# Patient Record
Sex: Female | Born: 1960 | Race: White | Hispanic: No | Marital: Single | Smoking: Current every day smoker
Health system: Southern US, Community
[De-identification: ages and names within clinical notes are randomized; demographics above are authoritative.]

## PROBLEM LIST (undated history)

## (undated) DIAGNOSIS — I10 Essential (primary) hypertension: Secondary | ICD-10-CM

## (undated) DIAGNOSIS — E119 Type 2 diabetes mellitus without complications: Secondary | ICD-10-CM

## (undated) DIAGNOSIS — C801 Malignant (primary) neoplasm, unspecified: Secondary | ICD-10-CM

## (undated) HISTORY — PX: ABDOMINAL HYSTERECTOMY: SHX81

## (undated) HISTORY — PX: SKIN CANCER EXCISION: SHX779

---

## 2004-01-11 ENCOUNTER — Emergency Department: Payer: Self-pay | Admitting: Emergency Medicine

## 2004-03-17 ENCOUNTER — Ambulatory Visit: Payer: Self-pay | Admitting: Family Medicine

## 2004-04-30 ENCOUNTER — Ambulatory Visit: Payer: Self-pay

## 2004-06-28 ENCOUNTER — Ambulatory Visit: Payer: Self-pay | Admitting: Gastroenterology

## 2004-06-29 ENCOUNTER — Emergency Department: Payer: Self-pay | Admitting: Emergency Medicine

## 2004-07-21 ENCOUNTER — Ambulatory Visit: Payer: Self-pay | Admitting: Gastroenterology

## 2004-10-22 ENCOUNTER — Ambulatory Visit: Payer: Self-pay | Admitting: Nurse Practitioner

## 2004-11-24 ENCOUNTER — Emergency Department: Payer: Self-pay | Admitting: Emergency Medicine

## 2005-04-19 ENCOUNTER — Ambulatory Visit: Payer: Self-pay | Admitting: Gastroenterology

## 2005-07-15 ENCOUNTER — Emergency Department: Payer: Self-pay | Admitting: Emergency Medicine

## 2006-08-16 ENCOUNTER — Emergency Department: Payer: Self-pay | Admitting: Emergency Medicine

## 2007-03-09 ENCOUNTER — Emergency Department: Payer: Self-pay | Admitting: Emergency Medicine

## 2007-03-22 ENCOUNTER — Ambulatory Visit: Payer: Self-pay | Admitting: Family Medicine

## 2010-08-09 ENCOUNTER — Emergency Department: Payer: Self-pay | Admitting: Emergency Medicine

## 2011-12-21 ENCOUNTER — Emergency Department: Payer: Self-pay | Admitting: Emergency Medicine

## 2012-04-09 ENCOUNTER — Emergency Department: Payer: Self-pay | Admitting: Emergency Medicine

## 2012-04-09 LAB — BASIC METABOLIC PANEL
Anion Gap: 6 — ABNORMAL LOW (ref 7–16)
BUN: 12 mg/dL (ref 7–18)
Calcium, Total: 9 mg/dL (ref 8.5–10.1)
Chloride: 104 mmol/L (ref 98–107)
Co2: 27 mmol/L (ref 21–32)
Creatinine: 0.7 mg/dL (ref 0.60–1.30)
EGFR (African American): >60
EGFR (Non-African Amer.): >60
Osmolality: 276 (ref 275–301)
Potassium: 4.3 mmol/L (ref 3.5–5.1)

## 2012-04-09 LAB — CBC
HCT: 40.7 % (ref 35.0–47.0)
HGB: 14 g/dL (ref 12.0–16.0)
MCH: 32.5 pg (ref 26.0–34.0)
MCHC: 34.3 g/dL (ref 32.0–36.0)
MCV: 95 fL (ref 80–100)
Platelet: 232 10*3/uL (ref 150–440)
RDW: 12.5 % (ref 11.5–14.5)
WBC: 8.4 10*3/uL (ref 3.6–11.0)

## 2012-04-09 LAB — TROPONIN I: Troponin-I: <0.02 ng/mL

## 2012-10-23 ENCOUNTER — Emergency Department: Payer: Self-pay | Admitting: Emergency Medicine

## 2012-10-23 LAB — CBC
HCT: 38.9 % (ref 35.0–47.0)
HGB: 13.4 g/dL (ref 12.0–16.0)
MCH: 33.3 pg (ref 26.0–34.0)
Platelet: 235 10*3/uL (ref 150–440)
RBC: 4.04 10*6/uL (ref 3.80–5.20)
RDW: 13.3 % (ref 11.5–14.5)
WBC: 10.1 10*3/uL (ref 3.6–11.0)

## 2012-10-23 LAB — COMPREHENSIVE METABOLIC PANEL
Albumin: 3.7 g/dL (ref 3.4–5.0)
Anion Gap: 3 — ABNORMAL LOW (ref 7–16)
BUN: 16 mg/dL (ref 7–18)
Bilirubin,Total: 0.3 mg/dL (ref 0.2–1.0)
Chloride: 104 mmol/L (ref 98–107)
Co2: 29 mmol/L (ref 21–32)
Creatinine: 0.91 mg/dL (ref 0.60–1.30)
EGFR (African American): >60
Glucose: 103 mg/dL — ABNORMAL HIGH (ref 65–99)
Sodium: 136 mmol/L (ref 136–145)
Total Protein: 6.9 g/dL (ref 6.4–8.2)

## 2012-10-23 LAB — URINALYSIS, COMPLETE
Bilirubin,UR: NEGATIVE
Blood: NEGATIVE
Ketone: NEGATIVE
Nitrite: NEGATIVE
Protein: NEGATIVE

## 2013-01-07 ENCOUNTER — Ambulatory Visit: Payer: Self-pay | Admitting: Family Medicine

## 2013-09-27 ENCOUNTER — Inpatient Hospital Stay: Payer: Self-pay | Admitting: Internal Medicine

## 2013-09-27 LAB — CBC WITH DIFFERENTIAL/PLATELET
BASOS PCT: 0.8 %
Basophil #: 0.1 10*3/uL (ref 0.0–0.1)
EOS ABS: 0.4 10*3/uL (ref 0.0–0.7)
Eosinophil %: 4.5 %
HCT: 41.3 % (ref 35.0–47.0)
HGB: 14 g/dL (ref 12.0–16.0)
LYMPHS ABS: 3.3 10*3/uL (ref 1.0–3.6)
Lymphocyte %: 38.3 %
MCH: 33.1 pg (ref 26.0–34.0)
MCHC: 34 g/dL (ref 32.0–36.0)
MCV: 97 fL (ref 80–100)
MONO ABS: 0.3 x10 3/mm (ref 0.2–0.9)
Monocyte %: 3.8 %
NEUTROS PCT: 52.6 %
Neutrophil #: 4.5 10*3/uL (ref 1.4–6.5)
PLATELETS: 232 10*3/uL (ref 150–440)
RBC: 4.24 10*6/uL (ref 3.80–5.20)
RDW: 12.7 % (ref 11.5–14.5)
WBC: 8.6 10*3/uL (ref 3.6–11.0)

## 2013-09-27 LAB — URINALYSIS, COMPLETE
Bilirubin,UR: NEGATIVE
Blood: NEGATIVE
GLUCOSE, UR: NEGATIVE mg/dL (ref 0–75)
Ketone: NEGATIVE
LEUKOCYTE ESTERASE: NEGATIVE
NITRITE: NEGATIVE
PH: 5 (ref 4.5–8.0)
Protein: NEGATIVE
SPECIFIC GRAVITY: 1.03 (ref 1.003–1.030)
Squamous Epithelial: 32
WBC UR: 2 /HPF (ref 0–5)

## 2013-09-27 LAB — BASIC METABOLIC PANEL
ANION GAP: 9 (ref 7–16)
BUN: 20 mg/dL — AB (ref 7–18)
Calcium, Total: 8.9 mg/dL (ref 8.5–10.1)
Chloride: 103 mmol/L (ref 98–107)
Co2: 29 mmol/L (ref 21–32)
Creatinine: 1 mg/dL (ref 0.60–1.30)
EGFR (Non-African Amer.): >60
GLUCOSE: 159 mg/dL — AB (ref 65–99)
OSMOLALITY: 287 (ref 275–301)
Potassium: 4 mmol/L (ref 3.5–5.1)
Sodium: 141 mmol/L (ref 136–145)

## 2013-09-27 LAB — LIPID PANEL
CHOLESTEROL: 263 mg/dL — AB (ref 0–200)
HDL Cholesterol: 36 mg/dL — ABNORMAL LOW (ref 40–60)
Triglycerides: 497 mg/dL — ABNORMAL HIGH (ref 0–200)

## 2013-09-27 LAB — TROPONIN I

## 2014-05-31 NOTE — Discharge Summary (Signed)
PATIENT NAME:  [name], BAUTCH MR#:  [id number] DATE OF BIRTH:  Sep 16, 1960  DATE OF ADMISSION:  09/27/2013 DATE OF DISCHARGE:  09/28/2013  PRESENTING COMPLAINT: Difficulty with vision.   DISCHARGE DIAGNOSES:  1.  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  2.  Hypertension.  3.  Hyperlipidemia.  4.  Type 2 diabetes.   CODE STATUS: Full code.   MEDICATIONS:  1.  Metoprolol 25 mg b.i.d.  2.  Citalopram 20 mg daily.  3.  Metformin 500 mg b.i.d.  4.  Protonix 40 mg p.o. daily.  5.  Aspirin 81 mg daily.  6.  Pravastatin 10 mg daily at bedtime.  7.  Plavix 75 mg daily.   Follow up with Dr. Clayborn Bigness in 1 to 2 weeks.   The patient advised smoking cessation.   Ultrasound carotid Doppler showed mild heterogenous atherosclerotic plaque results in less than 50% diameter stenosis of the left internal carotid artery. Trace atherosclerotic plaque on the right without evidence of stenosis. Vertebral arteries are patent. Echo Doppler showed ejection fraction of 55% to 60%, borderline left ventricular hypertrophy, mildly increased left ventricular posterior wall thickness. MRI of the brain showed acute infarct of the right PCA territory without hemorrhage. Urinalysis negative for urinary tract infection. CBC within normal limits. Basic metabolic panel within normal limits, except glucose of 159, cholesterol is 263, triglycerides 497, HDL is 36.   BRIEF SUMMARY OF HOSPITAL COURSE: [name] is a 54 year old with history of type 2 diabetes, hypertension along with hyperlipidemia comes in with:  1.  Acute right PCA territory stroke. The patient presented with blurred vision and headache for the last three days. She already is taking baby aspirin now. The patient was placed on aspirin plus Plavix and given her risk factors of hyperlipidemia, family history of stroke, diabetes and hyperlipidemia. Echo results as above were noted. Ultrasound carotid Doppler did not show any  blockages. PT; patient did very well with PT. No PT needs were needed at home. Her symptoms remained stable. She will follow up with Dr. Clayborn Bigness as outpatient.  2.  Hypertension, on metoprolol.  3.  Type 2 diabetes, on metformin.  4.  Hyperlipidemia, now started on pravastatin.  5.  Tobacco abuse. Smoking cessation advised.   Hospital stay otherwise remained stable.   CODE STATUS: The patient remained a full code.   TIME SPENT: 40 minutes.   ____________________________ Hart Rochester Posey Pronto, MD sap:jc D: 10/03/2013 12:26:58 ET T: 10/03/2013 13:36:03 ET JOB#: [id number]  cc: Tawyna Pellot A. Posey Pronto, MD, <Dictator> Ilda Basset MD ELECTRONICALLY SIGNED 10/15/2013 14:50

## 2014-05-31 NOTE — H&P (Signed)
PATIENT NAME:  [name], [name] MR#:  [id number] DATE OF BIRTH:  [birth year]  DATE OF ADMISSION:  09/27/2013  PRIMARY CARE PHYSICIAN:  Lavera Guise, MD.  CHIEF COMPLAINT: Blurred vision and headache  for the last 3 days.   HISTORY OF PRESENT ILLNESS: The patient is a 54 year old pleasant Caucasian female with past medical history of hypertension, hyperlipidemia, and type 2 diabetes with history of smoking, comes to the Emergency Room after she started having increasing blurred vision while driving up to work. She has been having some headaches, more on the right and some sinus congestion thinking it was her sinusitis and sinus congestion. She did not pay much attention until she started having very fuzzy blurred vision. She came to the Emergency Room and was found to have acute infarct, right PCA territory, without hemorrhage. She is being admitted for further evaluation and management. The patient received an aspirin 324 mg p.o. in the Emergency Room. She is hemodynamically stable. She had some tingling in both her fingers and some unsteady gait for the last 3 days. Denies any dysarthria, dysphagia or any other focal motor weakness. The patient normally takes aspirin 81 mg at home.   PAST MEDICAL HISTORY:  1. Diabetes.  2. Hypertension.  3. History of hyperlipidemia, not on any statins.  4. Tubal ligation.  5. C-section.   ALLERGIES: MORPHINE AND SULFA DRUGS.   MEDICATIONS:  1. Aspirin 81 mg daily.  2. Zyloprim 20 mg daily.  3. Metformin 500 mg b.i.d.  4. Metoprolol 25 mg b.i.d.  5. Protonix 20 mg p.o. daily.   SOCIAL HISTORY: She works in [employer]. She smokes about a pack a day. Denies any alcohol use. Smoking cessation advised.   FAMILY HISTORY: Positive for stroke in grandmother, who died of it and grandmother.   REVIEW OF SYSTEMS:  CONSTITUTIONAL: No fever, fatigue, weakness.  EYES: No fever, fatigue, or weakness.  EYES: Positive for blurred vision. No glaucoma or  cataract.  EARS, NOSE, THROAT: No tinnitus, ear pain, hearing loss or postnasal drip.  RESPIRATORY: No cough, wheeze, hemoptysis or dyspnea.  CARDIOVASCULAR: No orthopnea, chest pain, edema or arrhythmia. Positive for hypertension.  GASTROINTESTINAL: No nausea, vomiting, diarrhea, abdominal pain, or GERD.  GENITOURINARY: No dysuria, hematuria, or frequency.  ENDOCRINE: No polyuria, nocturia or thyroid problems.  HEMATOLOGY: No anemia or easy bruising.  SKIN: No acne, rash or lesion.  MUSCULOSKELETAL: Positive for arthritis and back pain. No swelling or gout.  NEUROLOGIC: Positive for weakness, numbness, and difficult vision. Positive for CVA.  PSYCHIATRIC: No anxiety, depression or bipolar disorder.   All other systems reviewed and negative.   PHYSICAL EXAMINATION:  GENERAL: The patient is awake, alert, oriented x3, not in acute distress. VITAL SIGNS: Afebrile, pulse is 69, blood pressure is 176/92, saturations are 100% on room air.  HEENT: Atraumatic, normocephalic. Pupils: PERRLA. EOMI intact. Oral mucosa is moist.  NECK: Supple. No JVD. No carotid bruit.  LUNGS: Clear to auscultation bilaterally. No rales, rhonchi, respiratory distress or labored breathing.  CARDIOVASCULAR: Both heart sounds are normal. Rate, rhythm regular. PMI not lateralized. Chest is nontender.  EXTREMITIES: Good pedal pulses, good femoral pulses. No lower extremity edema.  ABDOMEN: Soft, benign, nontender. No organomegaly. Positive bowel sounds.  NEUROLOGIC: Grossly intact cranial nerves II through XII. No motor or sensory deficit. The patient has blurred vision bilaterally. Her pupils are reactive bilaterally to light. No nystagmus. No facial droop. Sensory exam and motor system exam appears normal. Reflexes 1+ in  both upper and lower extremities. Plantars downgoing. Gait not tested. Unable to do a detailed eye examination.  SKIN: Warm and dry.  PSYCHIATRIC: The patient is awake, alert, oriented x3.   LABORATORY  DATA: EKG normal sinus rhythm.   MRI of the brain shows acute infarct, right PCA territory without hemorrhage.   Urinalysis negative for urinary tract infection.   CBC within normal limits. Basic metabolic panel within normal limits. Glucose is 159, BUN is 20. Troponin is 0.02.   CT head without contrast  shows right temporal and occipital edema. Does not have typical appearance of infarct and is worrisome for mass lesion.   ASSESSMENT: A 54 year old patient with history of type 2 diabetes and hypertension along with hyperlipidemia, comes in with:  1. Acute right PCA territory stroke. The patient presented with blurred vision and headache for the last 3 days. She is already taking a baby aspirin. I will increase it to aspirin plus Plavix given her other risk factors. The patient does have risk factors of hypertension, diabetes, family history of stroke, hyperlipidemia, and history of hyperlipidemia. Will obtain echo Doppler of the heart and carotid Doppler as part of the stroke workup. 2. Hypertension. Resume her metoprolol. We will avoid sudden drops in blood pressure.  3. Type 2 diabetes. Sliding scale and p.o. metformin.  4. Hyperlipidemia. We will start the patient on pravastatin. She was supposed to start on some statin; however, she did not.  5. Deep vein prophylaxis, on aspirin and Plavix. The patient is ambulatory.  6. Physical therapy will see patient.   The patient's clinical course, hospital admission, and plan was discussed with the patient and the patient's son who was present in the Emergency Room. She remained a full code.   TIME SPENT: 50 minutes,   ____________________________ Gus Height A. Posey Pronto, MD sap:ls D: 09/27/2013 17:49:23 ET T: 09/27/2013 18:01:57 ET JOB#: [id number]  cc: Tyan Dy A. Posey Pronto, MD, <Dictator> Lavera Guise, MD Ilda Basset MD ELECTRONICALLY SIGNED 10/03/2013 12:30

## 2014-05-31 NOTE — H&P (Signed)
PATIENT NAME:  [name], [name] MR#:  [id number] DATE OF BIRTH:  1961/01/02  DATE OF ADMISSION:  09/27/2013  NO DICTATION<AutoDeleted>   ____________________________ Gus Height A. Posey Pronto, MD sap:ls D: 09/27/2013 17:42:01 ET T: 09/27/2013 18:01:11 ET JOB#: [id number]  cc: Ernest Orr A. Posey Pronto, MD, <Dictator> Ilda Basset MD ELECTRONICALLY SIGNED 10/03/2013 12:29

## 2014-07-01 ENCOUNTER — Encounter (HOSPITAL_COMMUNITY): Payer: Self-pay | Admitting: Cardiology

## 2014-07-01 ENCOUNTER — Emergency Department (HOSPITAL_COMMUNITY): Payer: Medicaid Other

## 2014-07-01 ENCOUNTER — Emergency Department (HOSPITAL_COMMUNITY)
Admission: EM | Admit: 2014-07-01 | Discharge: 2014-07-01 | Disposition: A | Discharge status: Home / Self Care | Payer: Medicaid Other | Attending: Emergency Medicine | Admitting: Emergency Medicine

## 2014-07-01 DIAGNOSIS — Y9289 Other specified places as the place of occurrence of the external cause: Secondary | ICD-10-CM | POA: Insufficient documentation

## 2014-07-01 DIAGNOSIS — Z79899 Other long term (current) drug therapy: Secondary | ICD-10-CM | POA: Insufficient documentation

## 2014-07-01 DIAGNOSIS — S52092A Other fracture of upper end of left ulna, initial encounter for closed fracture: Secondary | ICD-10-CM | POA: Diagnosis not present

## 2014-07-01 DIAGNOSIS — Y998 Other external cause status: Secondary | ICD-10-CM | POA: Diagnosis not present

## 2014-07-01 DIAGNOSIS — E119 Type 2 diabetes mellitus without complications: Secondary | ICD-10-CM | POA: Diagnosis not present

## 2014-07-01 DIAGNOSIS — Z720 Tobacco use: Secondary | ICD-10-CM | POA: Insufficient documentation

## 2014-07-01 DIAGNOSIS — Z85828 Personal history of other malignant neoplasm of skin: Secondary | ICD-10-CM | POA: Diagnosis not present

## 2014-07-01 DIAGNOSIS — S53402A Unspecified sprain of left elbow, initial encounter: Secondary | ICD-10-CM | POA: Insufficient documentation

## 2014-07-01 DIAGNOSIS — S59902A Unspecified injury of left elbow, initial encounter: Secondary | ICD-10-CM | POA: Diagnosis present

## 2014-07-01 DIAGNOSIS — X58XXXA Exposure to other specified factors, initial encounter: Secondary | ICD-10-CM | POA: Insufficient documentation

## 2014-07-01 DIAGNOSIS — S42402A Unspecified fracture of lower end of left humerus, initial encounter for closed fracture: Secondary | ICD-10-CM

## 2014-07-01 DIAGNOSIS — Y9389 Activity, other specified: Secondary | ICD-10-CM | POA: Insufficient documentation

## 2014-07-01 DIAGNOSIS — I10 Essential (primary) hypertension: Secondary | ICD-10-CM | POA: Insufficient documentation

## 2014-07-01 HISTORY — DX: Malignant (primary) neoplasm, unspecified: C80.1

## 2014-07-01 HISTORY — DX: Essential (primary) hypertension: I10

## 2014-07-01 HISTORY — DX: Type 2 diabetes mellitus without complications: E11.9

## 2014-07-01 MED ORDER — HYDROCODONE-ACETAMINOPHEN 5-325 MG PO TABS: 1.0000 | ORAL_TABLET | ORAL | Status: DC | PRN

## 2014-07-01 MED ORDER — HYDROCODONE-ACETAMINOPHEN 5-325 MG PO TABS
1.0000 | ORAL_TABLET | Freq: Once | ORAL | Status: AC
  Administered 2014-07-01: 1 via ORAL
  Filled 2014-07-01: qty 1

## 2014-07-01 NOTE — ED Notes (Signed)
Injured left arm 3 weeks ago.  Was holding a child in her arms and the child jumped and pt states she felt a pop in the left arm at that time.  States pain is no better.

## 2014-07-01 NOTE — Discharge Instructions (Signed)
Sprain A sprain happens when the bands of tissue that connect bones and hold joints together (ligaments) stretch too much or tear. HOME CARE  Raise (elevate) the injured area to lessen puffiness (swelling).  Put ice on the injured area 2 times a day for 2-3 days.  Put ice in a plastic bag.  Place a towel between your skin and the bag.  Leave the ice on for 15 minutes.  Only take medicine as told by your doctor.  Protect your injured area until your pain and stiffness go away.  Do not get your cast or splint wet. Cover your cast or splint with a plastic bag when you shower or take a bath. Do not swim in a pool.  Your doctor may suggest exercises during your recovery to keep from getting stiff. GET HELP RIGHT AWAY IF:   Your cast or splint becomes damaged.  Your pain gets worse. MAKE SURE YOU:   Understand these instructions.  Will watch this condition.  Will get help right away if you are not doing well or get worse. Document Released: 07/13/2007 Document Revised: 11/14/2012 Document Reviewed: 02/05/2011 [facility] Patient Information 2015 [place], [place]. This information is not intended to replace advice given to you by your health care provider. Make sure you discuss any questions you have with your health care provider.   You may take the hydrocodone prescribed for pain relief.  This will make you drowsy - do not drive within 4 hours of taking this medication.

## 2014-07-03 NOTE — ED Provider Notes (Signed)
CSN: [id number]     Arrival date & time 07/01/14  1242 History   First MD Initiated Contact with Patient 07/01/14 1306     No chief complaint on file.    (Consider location/radiation/quality/duration/timing/severity/associated sxs/prior Treatment) The history is provided by the patient.   [name] is a 54 y.o.  right handed female  With a 3 week history of pain in her left elbow since hyperextending it while attempting to "boost" her grandchild over a horse fence.  She has used ice, rest, tylenol and motrin and continues to have pain.  She denies weakness distal to the elbow, no swelling, numbness or other complaint.   Past Medical History  Diagnosis Date  . Diabetes mellitus without complication   . Hypertension   . Cancer    Past Surgical History  Procedure Laterality Date  . Abdominal hysterectomy    . Skin cancer excision     History reviewed. No pertinent family history. History  Substance Use Topics  . Smoking status: Current Every Day Smoker  . Smokeless tobacco: Not on file  . Alcohol Use: No   OB History    No data available     Review of Systems  Constitutional: Negative for fever.  Musculoskeletal: Positive for arthralgias. Negative for myalgias and joint swelling.  Skin: Negative.   Neurological: Negative for weakness and numbness.      Allergies  Doxycycline and Sulfur  Home Medications   Prior to Admission medications   Medication Sig Start Date End Date Taking? Authorizing Provider  aspirin-acetaminophen-caffeine (EXCEDRIN MIGRAINE) [phone] MG per tablet Take 1 tablet by mouth every 6 (six) hours as needed for headache.   Yes Historical Provider, MD  metFORMIN (GLUCOPHAGE) 500 MG tablet Take 500 mg by mouth 2 (two) times daily with a meal.   Yes Historical Provider, MD  metoprolol tartrate (LOPRESSOR) 25 MG tablet Take 25 mg by mouth 2 (two) times daily.   Yes Historical Provider, MD  HYDROcodone-acetaminophen (NORCO/VICODIN) 5-325 MG per  tablet Take 1 tablet by mouth every 4 (four) hours as needed. 07/01/14   Gina Jefferson, PA-C   BP 154/72 mmHg  Pulse 78  Temp(Src) 98.2 F (36.8 C) (Oral)  Resp 18  Ht 5' 4.5" (1.638 m)  Wt 187 lb (84.823 kg)  BMI 31.61 kg/m2  SpO2 98% Physical Exam  Constitutional: She appears well-developed and well-nourished.  HENT:  Head: Atraumatic.  Neck: Normal range of motion.  Cardiovascular:  Pulses equal bilaterally  Musculoskeletal: She exhibits tenderness.       Left elbow: She exhibits normal range of motion, no swelling and no effusion. Tenderness found. Medial epicondyle tenderness noted.  Neurological: She is alert. She has normal strength. She displays normal reflexes. No sensory deficit.  Skin: Skin is warm and dry.  Psychiatric: She has a normal mood and affect.    ED Course  Procedures (including critical care time) Labs Review Labs Reviewed - No data to display  Imaging Review No results found.   EKG Interpretation None      MDM   Final diagnoses:  Elbow sprain, left, initial encounter  Elbow fracture, left, closed, initial encounter    Patients labs and/or radiological studies were reviewed and considered during the medical decision making and disposition process.  Results were also discussed with patient. Pt was placed in a sling, hydrocodone prescribed. Ortho referrals given for f/u care.    The patient appears reasonably screened and/or stabilized for discharge and I doubt any other  medical condition or other [facility] requiring further screening, evaluation, or treatment in the ED at this time prior to discharge.     Gina Jefferson, PA-C 07/03/14 1852  Gina Morrison, MD 07/08/14 [phone]

## 2018-03-02 ENCOUNTER — Observation Stay: Payer: Medicaid Other

## 2018-03-02 ENCOUNTER — Observation Stay
Admission: EM | Admit: 2018-03-02 | Discharge: 2018-03-03 | Disposition: A | Discharge status: Home / Self Care | Payer: Medicaid Other | Attending: Internal Medicine | Admitting: Internal Medicine

## 2018-03-02 ENCOUNTER — Emergency Department: Payer: Medicaid Other

## 2018-03-02 ENCOUNTER — Encounter: Payer: Self-pay | Admitting: Emergency Medicine

## 2018-03-02 ENCOUNTER — Other Ambulatory Visit: Payer: Self-pay

## 2018-03-02 DIAGNOSIS — F17200 Nicotine dependence, unspecified, uncomplicated: Secondary | ICD-10-CM | POA: Diagnosis not present

## 2018-03-02 DIAGNOSIS — Z8673 Personal history of transient ischemic attack (TIA), and cerebral infarction without residual deficits: Secondary | ICD-10-CM | POA: Insufficient documentation

## 2018-03-02 DIAGNOSIS — R55 Syncope and collapse: Secondary | ICD-10-CM | POA: Diagnosis present

## 2018-03-02 DIAGNOSIS — E119 Type 2 diabetes mellitus without complications: Secondary | ICD-10-CM | POA: Insufficient documentation

## 2018-03-02 DIAGNOSIS — H6690 Otitis media, unspecified, unspecified ear: Secondary | ICD-10-CM | POA: Insufficient documentation

## 2018-03-02 DIAGNOSIS — Z7984 Long term (current) use of oral hypoglycemic drugs: Secondary | ICD-10-CM | POA: Insufficient documentation

## 2018-03-02 DIAGNOSIS — G459 Transient cerebral ischemic attack, unspecified: Principal | ICD-10-CM | POA: Insufficient documentation

## 2018-03-02 DIAGNOSIS — K219 Gastro-esophageal reflux disease without esophagitis: Secondary | ICD-10-CM | POA: Diagnosis not present

## 2018-03-02 DIAGNOSIS — Z79899 Other long term (current) drug therapy: Secondary | ICD-10-CM | POA: Diagnosis not present

## 2018-03-02 DIAGNOSIS — F329 Major depressive disorder, single episode, unspecified: Secondary | ICD-10-CM | POA: Insufficient documentation

## 2018-03-02 DIAGNOSIS — I10 Essential (primary) hypertension: Secondary | ICD-10-CM | POA: Diagnosis not present

## 2018-03-02 DIAGNOSIS — M545 Low back pain: Secondary | ICD-10-CM | POA: Diagnosis not present

## 2018-03-02 LAB — COMPREHENSIVE METABOLIC PANEL
ALT: 12 U/L (ref 0–44)
ANION GAP: 7 (ref 5–15)
AST: 13 U/L — ABNORMAL LOW (ref 15–41)
Albumin: 4.3 g/dL (ref 3.5–5.0)
Alkaline Phosphatase: 74 U/L (ref 38–126)
BUN: 14 mg/dL (ref 6–20)
CALCIUM: 9.9 mg/dL (ref 8.9–10.3)
CO2: 31 mmol/L (ref 22–32)
Chloride: 100 mmol/L (ref 98–111)
Creatinine, Ser: 0.87 mg/dL (ref 0.44–1.00)
Glucose, Bld: 129 mg/dL — ABNORMAL HIGH (ref 70–99)
POTASSIUM: 3.9 mmol/L (ref 3.5–5.1)
Sodium: 138 mmol/L (ref 135–145)
TOTAL PROTEIN: 7.8 g/dL (ref 6.5–8.1)
Total Bilirubin: 0.9 mg/dL (ref 0.3–1.2)

## 2018-03-02 LAB — TROPONIN I: Troponin I: <0.03 ng/mL (ref <0.03)

## 2018-03-02 LAB — CBC
HCT: 43.4 % (ref 36.0–46.0)
Hemoglobin: 14.7 g/dL (ref 12.0–15.0)
MCH: 31.9 pg (ref 26.0–34.0)
MCHC: 33.9 g/dL (ref 30.0–36.0)
MCV: 94.1 fL (ref 80.0–100.0)
Platelets: 295 10*3/uL (ref 150–400)
RBC: 4.61 MIL/uL (ref 3.87–5.11)
RDW: 12.4 % (ref 11.5–15.5)
WBC: 9.7 10*3/uL (ref 4.0–10.5)
nRBC: 0 % (ref 0.0–0.2)

## 2018-03-02 LAB — GLUCOSE, CAPILLARY
GLUCOSE-CAPILLARY: 160 mg/dL — AB (ref 70–99)
Glucose-Capillary: 146 mg/dL — ABNORMAL HIGH (ref 70–99)
Glucose-Capillary: 163 mg/dL — ABNORMAL HIGH (ref 70–99)

## 2018-03-02 MED ORDER — METOPROLOL TARTRATE 25 MG PO TABS
25.0000 mg | ORAL_TABLET | Freq: Two times a day (BID) | ORAL | Status: DC
  Administered 2018-03-02 – 2018-03-03 (×3): 25 mg via ORAL
  Filled 2018-03-02 (×3): qty 1

## 2018-03-02 MED ORDER — IOHEXOL 350 MG/ML SOLN
75.0000 mL | Freq: Once | INTRAVENOUS | Status: AC | PRN
  Administered 2018-03-02: 75 mL via INTRAVENOUS

## 2018-03-02 MED ORDER — ACETAMINOPHEN 160 MG/5ML PO SOLN
650.0000 mg | ORAL | Status: DC | PRN
  Filled 2018-03-02: qty 20.3

## 2018-03-02 MED ORDER — ACETAMINOPHEN 650 MG RE SUPP: 650.0000 mg | RECTAL | Status: DC | PRN

## 2018-03-02 MED ORDER — STROKE: EARLY STAGES OF RECOVERY BOOK
Freq: Once | Status: AC
  Administered 2018-03-02: 19:00:00

## 2018-03-02 MED ORDER — PANTOPRAZOLE SODIUM 40 MG PO TBEC
40.0000 mg | DELAYED_RELEASE_TABLET | Freq: Every day | ORAL | Status: DC
  Administered 2018-03-02 – 2018-03-03 (×2): 40 mg via ORAL
  Filled 2018-03-02 (×2): qty 1

## 2018-03-02 MED ORDER — CITALOPRAM HYDROBROMIDE 20 MG PO TABS
20.0000 mg | ORAL_TABLET | Freq: Every day | ORAL | Status: DC
  Administered 2018-03-02: 20 mg via ORAL
  Filled 2018-03-02: qty 1

## 2018-03-02 MED ORDER — INSULIN ASPART 100 UNIT/ML ~~LOC~~ SOLN
0.0000 [IU] | Freq: Three times a day (TID) | SUBCUTANEOUS | Status: DC
  Administered 2018-03-03: 1 [IU] via SUBCUTANEOUS
  Filled 2018-03-02: qty 1

## 2018-03-02 MED ORDER — ENOXAPARIN SODIUM 40 MG/0.4ML ~~LOC~~ SOLN
40.0000 mg | SUBCUTANEOUS | Status: DC
  Administered 2018-03-02: 40 mg via SUBCUTANEOUS
  Filled 2018-03-02: qty 0.4

## 2018-03-02 MED ORDER — METFORMIN HCL 500 MG PO TABS
500.0000 mg | ORAL_TABLET | Freq: Two times a day (BID) | ORAL | Status: DC
  Administered 2018-03-02 – 2018-03-03 (×2): 500 mg via ORAL
  Filled 2018-03-02 (×3): qty 1

## 2018-03-02 MED ORDER — ASPIRIN 81 MG PO CHEW
324.0000 mg | CHEWABLE_TABLET | Freq: Once | ORAL | Status: AC
  Administered 2018-03-02: 324 mg via ORAL
  Filled 2018-03-02: qty 4

## 2018-03-02 MED ORDER — ACETAMINOPHEN 325 MG PO TABS
650.0000 mg | ORAL_TABLET | ORAL | Status: DC | PRN
  Administered 2018-03-03: 650 mg via ORAL
  Filled 2018-03-02: qty 2

## 2018-03-02 NOTE — ED Provider Notes (Signed)
[facility] Emergency Department Provider Note  Time seen: 11:46 AM  I have reviewed the triage vital signs and the nursing notes.   HISTORY  Chief Complaint Weakness    HPI [name] is a 58 y.o. female a past medical history of hypertension, diabetes, CVA, presents to the emergency department for acute onset of memory impairment and visual deficit.  According to the patient around 9:00 this morning she was getting ready in the kitchen, she does not remember walking to the living room or sitting down, her vision faded to black although states she did not lose consciousness because she remembers hearing the TV the whole time.  Patient states a similar event happened 2 or 3 or 4 years ago when she had a CVA.  States her vision is improved, her memory is coming back but still has no recollection of walking to the living room or sitting down in the living room.  Patient states a similar event happened approximately 4 months ago but she did not seek medical care at that time.  Patient denies any weakness or numbness of any arm or leg today, confusion or slurred speech.   Past Medical History:  Diagnosis Date  . Cancer ([place])   . Diabetes mellitus without complication ([place])   . Hypertension     There are no active problems to display for this patient.   Past Surgical History:  Procedure Laterality Date  . ABDOMINAL HYSTERECTOMY    . SKIN CANCER EXCISION      Prior to Admission medications   Medication Sig Start Date End Date Taking? Authorizing Provider  aspirin-acetaminophen-caffeine (EXCEDRIN MIGRAINE) [phone] MG per tablet Take 1 tablet by mouth every 6 (six) hours as needed for headache.    [provider]  HYDROcodone-acetaminophen (NORCO/VICODIN) 5-325 MG per tablet Take 1 tablet by mouth every 4 (four) hours as needed. 07/01/14   [name], PA-C  metFORMIN (GLUCOPHAGE) 500 MG tablet Take 500 mg by mouth 2 (two) times daily with a meal.     [provider]  metoprolol tartrate (LOPRESSOR) 25 MG tablet Take 25 mg by mouth 2 (two) times daily.    [provider]    Allergies  Allergen Reactions  . Doxycycline     Unsure of reaction.  But was told not to take any antibiodics with an ine at the end.   . Sulfur Rash    History reviewed. No pertinent family history.  Social History Social History   Tobacco Use  . Smoking status: Current Every Day Smoker  Substance Use Topics  . Alcohol use: No  . Drug use: Not on file    Review of Systems Constitutional: Negative for fever. Cardiovascular: Negative for chest pain. Respiratory: Negative for shortness of breath. Gastrointestinal: Negative for abdominal pain, vomiting and diarrhea. Genitourinary: Negative for urinary compaints Musculoskeletal: Negative for musculoskeletal complaints Skin: Negative for skin complaints  Neurological: Mild headache behind left eye.  Memory impairment and visual deficit now improved. All other ROS negative  ____________________________________________   PHYSICAL EXAM:  VITAL SIGNS: ED Triage Vitals  Enc Vitals Group     BP 03/02/18 1103 (!) 163/95     Pulse Rate 03/02/18 1103 96     Resp 03/02/18 1103 18     Temp 03/02/18 1103 98.4 F (36.9 C)     Temp Source 03/02/18 1103 Oral     SpO2 03/02/18 1103 100 %     Weight 03/02/18 1104 160 lb (72.6  kg)     Height 03/02/18 1104 5\' 4"  (1.626 m)     Head Circumference --      Peak Flow --      Pain Score 03/02/18 1103 0     Pain Loc --      Pain Edu? --      Excl. in [place]? --    Constitutional: Alert and oriented. Well appearing and in no distress. Eyes: Normal exam ENT   Head: Normocephalic and atraumatic   Mouth/Throat: Mucous membranes are moist. Cardiovascular: Normal rate, regular rhythm. No murmur Respiratory: Normal respiratory effort without tachypnea nor retractions. Breath sounds are clear  Gastrointestinal: Soft and nontender. No distention.    Musculoskeletal: Nontender with normal range of motion in all extremities Neurologic:  Normal speech and language.  Mild left upper extremity drift otherwise normal neurological exam. Skin:  Skin is warm, dry and intact.  Psychiatric: Mood and affect are normal.  ____________________________________________    EKG  EKG viewed and interpreted by myself shows normal sinus rhythm at 93 bpm with a narrow QRS, normal axis, normal intervals, no concerning ST changes.  ____________________________________________    [id number]  CT shows evolution of old stroke without acute CVA.  ____________________________________________   INITIAL IMPRESSION / ASSESSMENT AND PLAN / ED COURSE  Pertinent labs & imaging results that were available during my care of the patient were reviewed by me and considered in my medical decision making (see chart for details).  To the emergency department with acute onset of visual deficit and memory impairment beginning at 9:00 this morning.  I reviewed the patient's records, in 2015 patient was admitted for similar episode of visual loss and memory impairment per patient, at that time she was diagnosed with acute CVA on MRI.  Given today's events although improving differential would include TIA, CVA, ICH, mass/tumor, metabolic or electrolyte abnormality, infectious etiology.  We will check labs, urinalysis obtain CT imaging of the head.  On examination patient does appear to have mild left pronator drift as her only finding during my evaluation.  CT scan shows evolution of old stroke without new stroke.  Labs are largely within normal limits.  However given the patient's concerning presentation especially has similar was to 2015 when she suffered a CVA patient will be admitted to the hospital service for further work-up.  NIH Stroke Scale   Interval: Baseline Time: 11:49 AM Person Administering Scale: Harvest Dark  Administer stroke scale items in the  order listed. Record performance in each category after each subscale exam. Do not go back and change scores. Follow directions provided for each exam technique. Scores should reflect what the patient does, not what the clinician thinks the patient can do. The clinician should record answers while administering the exam and work quickly. Except where indicated, the patient should not be coached (i.e., repeated requests to patient to make a special effort).   1a  Level of consciousness: 0=alert; keenly responsive  1b. LOC questions:  0=Performs both tasks correctly  1c. LOC commands: 0=Performs both tasks correctly  2.  Best Gaze: 0=normal  3.  Visual: 0=No visual loss  4. Facial Palsy: 0=Normal symmetric movement  5a.  Motor left arm: 1=Drift, limb holds 90 (or 45) degrees but drifts down before full 10 seconds: does not hit bed  5b.  Motor right arm: 0=No drift, limb holds 90 (or 45) degrees for full 10 seconds  6a. motor left leg: 0=No drift, limb holds 90 (or 45) degrees for  full 10 seconds  6b  Motor right leg:  0=No drift, limb holds 90 (or 45) degrees for full 10 seconds  7. Limb Ataxia: 0=Absent  8.  Sensory: 0=Normal; no sensory loss  9. Best Language:  0=No aphasia, normal  10. Dysarthria: 0=Normal  11. Extinction and Inattention: 0=No abnormality  12. Distal motor function: 0=Normal   Total:   1    ____________________________________________   FINAL CLINICAL IMPRESSION(S) / ED DIAGNOSES  CVA   Harvest Dark, MD 03/02/18 1329

## 2018-03-02 NOTE — Plan of Care (Signed)
Pt education started, will continue to monitor

## 2018-03-02 NOTE — ED Notes (Signed)
No code stroke per dr Kerman Passey, will bring to room for provider to see pt.

## 2018-03-02 NOTE — ED Triage Notes (Signed)
Pt reports had headache last night that went away.  Today pt woke up normal at 730 and felt normal.  She went to kitchen and describes losing vision in both eyes like room was black but could still hear TV and news (started at 900 AM).  This lasted for about 1 hour.  Pt reports that she can now see but sounds like mosquitos in head, no pain.  Is diabetic has not checked sugar. Did not pass out, could still hear.

## 2018-03-02 NOTE — Consult Note (Signed)
Referring Physician: Fritzi Mandes MD   Chief Complaint: Transient loss of vision  HPI: [name] is an 58 y.o. female with past medical history of diabetes mellitus, hypertension, tobacco use, and right PCA stroke (2015) presenting to the ED with headache since last night and transient loss of vision associated with lightheadedness, nausea and diaphoresis. Patient states that she was walking towards her kitchen and when she turned around she suddenly lost his vision in both eyes. Patient describes episode as the room suddenly becoming dark without associated lightheadedness prompting her to sit down. Patient reports she could not recall hearing sounds and had TV and what the newscaster was talking about where she was not able to make out any images on TV or around the room.  No symptoms of eye redness or pain and tearing associated with visual loss (intermittent angle closure glaucoma). Patient states nothing seem to precipitate episode such as postural changes or exercise, loss of vision when eyes are moved into certain positions of gaze (gaze-evoked amaurosis) or loss of vision after exercise or a hot shower (Uhthoff's symptom) to suggest demyelinating disease of the optic nerve. She does not know how long she had lost vision but think it was about 1 minute or so.  Patient report associated symptoms following the episode of nausea, diaphoresis and head fuzziness with ringing sensation. Denies associated altered sensorium, speech abnormality, cranial nerve deficit, seizures, focal motor or sensory deficits, vomiting, ipsilateral or contralateral paralysis/weakness, numbness or tingling, involuntary movements or tremor. She denies history of head injury or trauma.  Patient thought that episode was probably related to her blood sugar since she had missed medication the previous night.  She also reported having a headache prior to going to bed last night without other associated symptoms. Prior to this episode,  patient also noticed right lower extremity weakness with stumbling around where she started using a cane to get around the house for last several days.  Date last known well: Date: 03/02/2018 Time last known well: Time: 09:00 tPA Given: No: outside window period  Past Medical History:  Diagnosis Date  . Cancer ([place])   . Diabetes mellitus without complication ([place])   . Hypertension     Past Surgical History:  Procedure Laterality Date  . ABDOMINAL HYSTERECTOMY    . SKIN CANCER EXCISION      History reviewed. No pertinent family history. Social History:  reports that she has been smoking. She does not have any smokeless tobacco history on file. She reports that she does not drink alcohol. No history on file for drug.  Allergies:  Allergies  Allergen Reactions  . Doxycycline     Unsure of reaction.  But was told not to take any antibiodics with an ine at the end.   . Sulfur Rash    Medications:  I have reviewed the patient's current medications. Prior to Admission:  Medications Prior to Admission  Medication Sig Dispense Refill Last Dose  . citalopram (CELEXA) 20 MG tablet Take 20 mg by mouth at bedtime.   03/01/2018 at 2000  . metFORMIN (GLUCOPHAGE) 500 MG tablet Take 500 mg by mouth 2 (two) times daily with a meal.   03/01/2018 at 0800  . metoprolol tartrate (LOPRESSOR) 25 MG tablet Take 25 mg by mouth 2 (two) times daily.   03/01/2018 at 0800  . RABEprazole (ACIPHEX) 20 MG tablet Take 20 mg by mouth daily.   03/01/2018 at 0800  . HYDROcodone-acetaminophen (NORCO/VICODIN) 5-325 MG per tablet Take 1  tablet by mouth every 4 (four) hours as needed. 20 tablet 0    Scheduled: .  stroke: mapping our early stages of recovery book   Does not apply Once  . citalopram  20 mg Oral QHS  . enoxaparin (LOVENOX) injection  40 mg Subcutaneous Q24H  . insulin aspart  0-9 Units Subcutaneous TID WC  . metFORMIN  500 mg Oral BID WC  . metoprolol tartrate  25 mg Oral BID  . pantoprazole  40 mg  Oral Daily    ROS: History obtained from the patient   General ROS: negative for - chills, fatigue, fever, night sweats, weight gain or weight loss Psychological ROS: negative for - behavioral disorder, hallucinations, memory difficulties, mood swings or suicidal ideation Ophthalmic ROS: negative for - blurry vision, double vision, eye pain. Positive for transient loss of vision ENT ROS: negative for - epistaxis, nasal discharge, oral lesions, sore throat, tinnitus or vertigo Allergy and Immunology ROS: negative for - hives or itchy/watery eyes Hematological and Lymphatic ROS: negative for - bleeding problems, bruising or swollen lymph nodes Endocrine ROS: negative for - galactorrhea, hair pattern changes, polydipsia/polyuria or temperature intolerance Respiratory ROS: negative for - cough, hemoptysis, shortness of breath or wheezing Cardiovascular ROS: negative for - chest pain, dyspnea on exertion, edema or irregular heartbeat Gastrointestinal ROS: negative for - abdominal pain, diarrhea, hematemesis, nausea/vomiting or stool incontinence Genito-Urinary ROS: negative for - dysuria, hematuria, incontinence or urinary frequency/urgency Musculoskeletal ROS: negative for - joint swelling or muscular weakness Neurological ROS: as noted in HPI Dermatological ROS: negative for rash and skin lesion changes  Physical Examination: Blood pressure (!) 159/95, pulse 78, temperature 98.4 F (36.9 C), temperature source Oral, resp. rate 13, height 5\' 4"  (1.626 m), weight 72.6 kg, SpO2 100 %.   HEENT-  Normocephalic, no lesions, without obvious abnormality.  Normal external eye and conjunctiva.  Normal TM's bilaterally.  Normal auditory canals and external ears. Normal external nose, mucus membranes and septum.  Normal pharynx. Cardiovascular- S1, S2 normal, pulses palpable throughout   Lungs- chest clear, no wheezing, rales, normal symmetric air entry Abdomen- soft, non-tender; bowel sounds normal; no  masses,  no organomegaly Extremities- no edema Lymph-no adenopathy palpable Musculoskeletal-no joint tenderness, deformity or swelling Skin-warm and dry, no hyperpigmentation, vitiligo, or suspicious lesions  Neurological Exam   Mental Status: Alert, oriented, thought content appropriate.  Speech fluent without evidence of aphasia.  Able to follow 3 step commands without difficulty. Attention span and concentration seemed appropriate  Cranial Nerves: II: Discs flat bilaterally; Visual fields grossly normal, pupils equal, round, reactive to light and accommodation III,IV, VI: ptosis not present, extra-ocular motions intact bilaterally V,VII: smile symmetric, facial light touch sensation intact VIII: hearing normal bilaterally IX,X: gag reflex present XI: bilateral shoulder shrug XII: midline tongue extension Motor: Right :  Upper extremity   5/5 Without pronator drift      Left: Upper extremity   5/5 without pronator drift Right:   Lower extremity   5/5                                          Left: Lower extremity   5/5 Tone and bulk:normal tone throughout; no atrophy noted Sensory: Pinprick and light touch intact bilaterally Deep Tendon Reflexes: 2+ and symmetric throughout Plantars: Right: mute  Left: mute Cerebellar: Finger-to-nose testing intact bilaterally. Heel to shin testing normal bilaterally Gait: not tested due to safety concerns  Data Reviewed  Laboratory Studies:  Basic Metabolic Panel: Recent Labs  Lab 03/02/18 1118  NA 138  K 3.9  CL 100  CO2 31  GLUCOSE 129*  BUN 14  CREATININE 0.87  CALCIUM 9.9    Liver Function Tests: Recent Labs  Lab 03/02/18 1118  AST 13*  ALT 12  ALKPHOS 74  BILITOT 0.9  PROT 7.8  ALBUMIN 4.3   No results for input(s): LIPASE, AMYLASE in the last 168 hours. No results for input(s): AMMONIA in the last 168 hours.  CBC: Recent Labs  Lab 03/02/18 1118  WBC 9.7  HGB 14.7  HCT 43.4  MCV  94.1  PLT 295    Cardiac Enzymes: Recent Labs  Lab 03/02/18 1118  TROPONINI <0.03    BNP: Invalid input(s): POCBNP  CBG: Recent Labs  Lab 03/02/18 1111  GLUCAP 160*    Microbiology: No results found for this or any previous visit.  Coagulation Studies: No results for input(s): LABPROT, INR in the last 72 hours.  Urinalysis: No results for input(s): COLORURINE, LABSPEC, PHURINE, GLUCOSEU, HGBUR, BILIRUBINUR, KETONESUR, PROTEINUR, UROBILINOGEN, NITRITE, LEUKOCYTESUR in the last 168 hours.  Invalid input(s): APPERANCEUR  Lipid Panel:    Component Value Date/Time   CHOL 263 (H) 09/27/2013 1025   TRIG 497 (H) 09/27/2013 1025   HDL 36 (L) 09/27/2013 1025   VLDL SEE COMMENT 09/27/2013 1025   LDLCALC SEE COMMENT 09/27/2013 1025    HgbA1C: No results found for: HGBA1C  Urine Drug Screen:  No results found for: LABOPIA, COCAINSCRNUR, LABBENZ, AMPHETMU, THCU, LABBARB  Alcohol Level: No results for input(s): ETH in the last 168 hours.  Other results: EKG: normal EKG, normal sinus rhythm, unchanged from previous tracings.  Imaging: Ct Head Wo Contrast  Result Date: 03/02/2018 CLINICAL DATA:  58 year old female with headache since last night but transient loss of vision in both eyes this morning. Left upper extremity weakness, ataxia. EXAM: CT HEAD WITHOUT CONTRAST TECHNIQUE: Contiguous axial images were obtained from the base of the skull through the vertex without intravenous contrast. COMPARISON:  Brain MRI and head CT 09/27/2013. FINDINGS: Brain: Medial right PCA territory chronic infarct with encephalomalacia. Elsewhere cerebral volume and gray-white differentiation appears stable negative; there is mild chronic nonspecific white matter hypodensity, mostly subcortical. No acute intracranial hemorrhage identified. No midline shift, mass effect, or evidence of intracranial mass lesion. No cortically based acute infarct identified. Vascular: Calcified atherosclerosis at the  skull base. No suspicious intracranial vascular hyperdensity. Skull: Negative. Sinuses/Orbits: Mild chronic maxillary sinus mucosal thickening and bubbly opacity in the sphenoid. Tympanic cavities and mastoids are clear. Other: Negative orbits soft tissues. Visualized scalp soft tissues are within normal limits. IMPRESSION: No acute intracranial abnormality. Expected evolution of the 2015 medial right PCA territory infarct, and mild chronic cerebral white matter disease. Electronically Signed   By: Genevie Ann M.D.   On: 03/02/2018 12:21   Assessment: 58 y.o. female  with past medical history of diabetes mellitus, hypertension, tobacco use, and right PCA stroke (2015) presenting to the ED with headache since last night and transient loss of vision associated with lightheadedness, nausea and diaphoresis. Differentials include possible pre-syncope, ischemic event, vertebral basilar insufficiency and maybe a seizure even though have very low suspicion given presentation with which resolution of symptoms would be delayed. Patient report she was not on anticoagulation or antiplatelet prior to this event.  With her hx of right PCA stroke it would be reasonable to perform further work up.  Stroke Risk Factors - diabetes mellitus, family history, hyperlipidemia, hypertension and smoking  Plan: 1. HgbA1c, fasting lipid panel 2. MRI of the brain without contrast 3. CTA head and neck with and without contrast 4. PT consult, OT consult, Speech consult 5. Echocardiogram 6. Prophylactic therapy-Antiplatelet med: Aspirin - dose 81 mg/day 7. Smoking cessation counseling 8. NPO until RN stroke swallow screen 9. Telemetry monitoring 10. Frequent neuro checks  This patient was staffed with Dr. Addison Lank, Leonel Ramsay who personally evaluated patient, reviewed documentation and agreed with assessment and plan of care as above.  Rufina Falco, DNP, FNP-BC Board certified Nurse Practitioner Neurology  Department    03/02/2018, 3:07 PM   I have seen the patient and reviewed the above note.  The description that she gives me of the event is that she had transient bilateral blurred vision, unsteadiness, lightheadedness, flushing of the face, nausea.  All of the symptoms put together are most consistent with presyncope.  The fact that her visual loss was bilateral would argue against a focal neurological disorder such as TIA, especially in the absence of focal weakness or numbness such as would be expected if she had transient basilar insufficiency.  I think that TIA work-up is not completely unreasonable, given that she does have a known posterior circulation infarct in the past and secondary stroke prevention measures should be used either way.  Work-up as described above, I will also order orthostatics.  Roland Rack, MD [facility] [phone]  If 7pm- 7am, please page neurology on call as listed in [place].

## 2018-03-02 NOTE — H&P (Signed)
[name]    MR#:  [id number]  DATE OF BIRTH:  04-Jun-1960  DATE OF ADMISSION:  03/02/2018  PRIMARY CARE PHYSICIAN: [facility] [place]   REQUESTING/REFERRING PHYSICIAN: dr Kerman Passey  CHIEF COMPLAINT:   Difficulty with vision and weakness right lower extremity HISTORY OF PRESENT ILLNESS:  [name]  is a 58 y.o. female with a known history of CVA in 2015, diabetes, hypertension, tobacco abuse comes to the emergency room after she noted blurred/black out vision this morning. Patient also noticed right lower extremity weakness with stumbling around where she started using a cane to get around the house for last several days. History of stroke in 2015. She does not take aspirin on a regular basis.  Patient currently is asymptomatic. Her visual symptoms have resolved. No speech deficit difficulty swallowing or focal neurological deficit. She is being admitted for TIA rule out CVA.  Received aspirin in the ER. CT had negative for acute stroke. No family in the ER  PAST MEDICAL HISTORY:   Past Medical History:  Diagnosis Date  . Cancer ([place])   . Diabetes mellitus without complication ([place])   . Hypertension     PAST SURGICAL HISTOIRY:   Past Surgical History:  Procedure Laterality Date  . ABDOMINAL HYSTERECTOMY    . SKIN CANCER EXCISION      SOCIAL HISTORY:   Social History   Tobacco Use  . Smoking status: Current Every Day Smoker  Substance Use Topics  . Alcohol use: No    FAMILY HISTORY:  History reviewed. No pertinent family history.  DRUG ALLERGIES:   Allergies  Allergen Reactions  . Doxycycline     Unsure of reaction.  But was told not to take any antibiodics with an ine at the end.   . Sulfur Rash    REVIEW OF SYSTEMS:  Review of Systems  Constitutional: Negative for chills, fever and weight loss.  HENT: Negative for ear discharge, ear pain and nosebleeds.    Eyes: Negative for blurred vision, pain and discharge.  Respiratory: Negative for sputum production, shortness of breath, wheezing and stridor.   Cardiovascular: Negative for chest pain, palpitations, orthopnea and PND.  Gastrointestinal: Negative for abdominal pain, diarrhea, nausea and vomiting.  Genitourinary: Negative for frequency and urgency.  Musculoskeletal: Negative for back pain and joint pain.  Neurological: Positive for focal weakness and weakness. Negative for sensory change and speech change.  Psychiatric/Behavioral: Negative for depression and hallucinations. The patient is not nervous/anxious.      MEDICATIONS AT HOME:   Prior to Admission medications   Medication Sig Start Date End Date Taking? Authorizing Provider  citalopram (CELEXA) 20 MG tablet Take 20 mg by mouth at bedtime. 02/22/12  Yes [provider]  metFORMIN (GLUCOPHAGE) 500 MG tablet Take 500 mg by mouth 2 (two) times daily with a meal.   Yes [provider]  metoprolol tartrate (LOPRESSOR) 25 MG tablet Take 25 mg by mouth 2 (two) times daily.   Yes [provider]  RABEprazole (ACIPHEX) 20 MG tablet Take 20 mg by mouth daily. 02/22/12  Yes [provider]  HYDROcodone-acetaminophen (NORCO/VICODIN) 5-325 MG per tablet Take 1 tablet by mouth every 4 (four) hours as needed. 07/01/14   [name], PA-C      VITAL SIGNS:  Blood pressure (!) 171/99, pulse 78, temperature 98.4 F (36.9 C), temperature source Oral, resp. rate 19, height 5\' 4"  (1.626 m), weight 72.6  kg, SpO2 100 %.  PHYSICAL EXAMINATION:  GENERAL:  58 y.o.-year-old patient lying in the bed with no acute distress.  EYES: Pupils equal, round, reactive to light and accommodation. No scleral icterus. Extraocular muscles intact.  HEENT: Head atraumatic, normocephalic. Oropharynx and nasopharynx clear.  NECK:  Supple, no jugular venous distention. No thyroid enlargement, no tenderness.  LUNGS: Normal breath sounds  bilaterally, no wheezing, rales,rhonchi or crepitation. No use of accessory muscles of respiration.  CARDIOVASCULAR: S1, S2 normal. No murmurs, rubs, or gallops.  ABDOMEN: Soft, nontender, nondistended. Bowel sounds present. No organomegaly or mass.  EXTREMITIES: No pedal edema, cyanosis, or clubbing.  NEUROLOGIC: Cranial nerves II through XII are intact. Muscle strength 5/5 in all extremities. Sensation intact. Gait not checked.  PSYCHIATRIC: The patient is alert and oriented x 3.  SKIN: No obvious rash, lesion, or ulcer.   LABORATORY PANEL:   CBC Recent Labs  Lab 03/02/18 1118  WBC 9.7  HGB 14.7  HCT 43.4  PLT 295   ------------------------------------------------------------------------------------------------------------------  Chemistries  Recent Labs  Lab 03/02/18 1118  NA 138  K 3.9  CL 100  CO2 31  GLUCOSE 129*  BUN 14  CREATININE 0.87  CALCIUM 9.9  AST 13*  ALT 12  ALKPHOS 74  BILITOT 0.9   ------------------------------------------------------------------------------------------------------------------  Cardiac Enzymes Recent Labs  Lab 03/02/18 1118  TROPONINI <0.03   ------------------------------------------------------------------------------------------------------------------  RADIOLOGY:  Ct Head Wo Contrast  Result Date: 03/02/2018 CLINICAL DATA:  58 year old female with headache since last night but transient loss of vision in both eyes this morning. Left upper extremity weakness, ataxia. EXAM: CT HEAD WITHOUT CONTRAST TECHNIQUE: Contiguous axial images were obtained from the base of the skull through the vertex without intravenous contrast. COMPARISON:  Brain MRI and head CT 09/27/2013. FINDINGS: Brain: Medial right PCA territory chronic infarct with encephalomalacia. Elsewhere cerebral volume and gray-white differentiation appears stable negative; there is mild chronic nonspecific white matter hypodensity, mostly subcortical. No acute intracranial  hemorrhage identified. No midline shift, mass effect, or evidence of intracranial mass lesion. No cortically based acute infarct identified. Vascular: Calcified atherosclerosis at the skull base. No suspicious intracranial vascular hyperdensity. Skull: Negative. Sinuses/Orbits: Mild chronic maxillary sinus mucosal thickening and bubbly opacity in the sphenoid. Tympanic cavities and mastoids are clear. Other: Negative orbits soft tissues. Visualized scalp soft tissues are within normal limits. IMPRESSION: No acute intracranial abnormality. Expected evolution of the 2015 medial right PCA territory infarct, and mild chronic cerebral white matter disease. Electronically Signed   By: Genevie Ann M.D.   On: 03/02/2018 12:21    EKG:   NSR IMPRESSION AND PLAN:   [name]  is a 58 y.o. female with a known history of CVA in 2015, diabetes, hypertension, tobacco abuse comes to the emergency room after she noted blurred/black out vision this morning. Patient also noticed right lower extremity weakness with stumbling around where she started using a cane to get around the house for last several days.  1. TIA rule out CVA -patient presented with blurred/blackout vision resolved now -she complains of some right-sided lower extremity weakness for last several days -aspirin 81 mg daily -check lipid profile, MRI of the brain, ultrasound carotid Doppler, echo -neuro- consultation. Notified Adult nurse via secure chat  2. Hypertension continue home meds  3. Diabetes -continue metformin and sliding scale  4. DVT prophylaxis subcu Lovenox   All the records are reviewed and case discussed with ED provider.   CODE STATUS: full  TOTAL TIME TAKING CARE  OF THIS PATIENT: *45* minutes.    Fritzi Mandes M.D on 03/02/2018 at 2:21 PM  Between 7am to 6pm - Pager - [phone]  After 6pm go to [email or web address] - password [email or web address] [facility]  [facility]  Office  [phone]  CC: Primary care  physician; [facility]

## 2018-03-03 ENCOUNTER — Observation Stay (HOSPITAL_BASED_OUTPATIENT_CLINIC_OR_DEPARTMENT_OTHER)
Admit: 2018-03-03 | Discharge: 2018-03-03 | Disposition: A | Discharge status: Home / Self Care | Payer: Medicaid Other | Attending: Internal Medicine | Admitting: Internal Medicine

## 2018-03-03 DIAGNOSIS — R42 Dizziness and giddiness: Secondary | ICD-10-CM

## 2018-03-03 DIAGNOSIS — G459 Transient cerebral ischemic attack, unspecified: Secondary | ICD-10-CM

## 2018-03-03 DIAGNOSIS — R55 Syncope and collapse: Secondary | ICD-10-CM

## 2018-03-03 LAB — LIPID PANEL
CHOLESTEROL: 254 mg/dL — AB (ref 0–200)
HDL: 40 mg/dL — ABNORMAL LOW (ref >40)
LDL Cholesterol: UNDETERMINED mg/dL (ref 0–99)
Total CHOL/HDL Ratio: 6.4 RATIO
Triglycerides: 647 mg/dL — ABNORMAL HIGH (ref <150)
VLDL: UNDETERMINED mg/dL (ref 0–40)

## 2018-03-03 LAB — HEMOGLOBIN A1C
Hgb A1c MFr Bld: 6.2 % — ABNORMAL HIGH (ref 4.8–5.6)
Mean Plasma Glucose: 131.24 mg/dL

## 2018-03-03 LAB — GLUCOSE, CAPILLARY
Glucose-Capillary: 128 mg/dL — ABNORMAL HIGH (ref 70–99)
Glucose-Capillary: 98 mg/dL (ref 70–99)

## 2018-03-03 LAB — ECHOCARDIOGRAM COMPLETE
Height: 64 in
Weight: 2560 oz

## 2018-03-03 MED ORDER — CITALOPRAM HYDROBROMIDE 20 MG PO TABS: 20.0000 mg | ORAL_TABLET | Freq: Every day | ORAL | 0 refills | Status: AC

## 2018-03-03 MED ORDER — ASPIRIN 81 MG PO TBEC: 81.0000 mg | DELAYED_RELEASE_TABLET | Freq: Every day | ORAL | 0 refills | Status: AC

## 2018-03-03 MED ORDER — AMOXICILLIN-POT CLAVULANATE 875-125 MG PO TABS: 1.0000 | ORAL_TABLET | Freq: Two times a day (BID) | ORAL | 0 refills | Status: DC

## 2018-03-03 MED ORDER — IBUPROFEN 400 MG PO TABS: 400.0000 mg | ORAL_TABLET | Freq: Every day | ORAL | Status: DC

## 2018-03-03 MED ORDER — IBUPROFEN 400 MG PO TABS: 400.0000 mg | ORAL_TABLET | Freq: Every day | ORAL | 0 refills | Status: AC

## 2018-03-03 MED ORDER — METFORMIN HCL 500 MG PO TABS: 500.0000 mg | ORAL_TABLET | Freq: Two times a day (BID) | ORAL | 0 refills | Status: AC

## 2018-03-03 MED ORDER — OMEPRAZOLE 20 MG PO CPDR: 20.0000 mg | DELAYED_RELEASE_CAPSULE | Freq: Every day | ORAL | 0 refills | Status: AC

## 2018-03-03 MED ORDER — LORATADINE 10 MG PO TABS: 10.0000 mg | ORAL_TABLET | Freq: Every day | ORAL | 0 refills | Status: AC

## 2018-03-03 MED ORDER — METOPROLOL TARTRATE 25 MG PO TABS: 25.0000 mg | ORAL_TABLET | Freq: Two times a day (BID) | ORAL | 0 refills | Status: AC

## 2018-03-03 MED ORDER — ATORVASTATIN CALCIUM 20 MG PO TABS: 40.0000 mg | ORAL_TABLET | Freq: Every day | ORAL | Status: DC

## 2018-03-03 MED ORDER — AMOXICILLIN-POT CLAVULANATE 875-125 MG PO TABS
1.0000 | ORAL_TABLET | Freq: Two times a day (BID) | ORAL | Status: DC
  Administered 2018-03-03: 1 via ORAL
  Filled 2018-03-03: qty 1

## 2018-03-03 MED ORDER — LORATADINE 10 MG PO TABS
10.0000 mg | ORAL_TABLET | Freq: Every day | ORAL | Status: DC
  Administered 2018-03-03: 13:00:00 10 mg via ORAL
  Filled 2018-03-03: qty 1

## 2018-03-03 MED ORDER — ATORVASTATIN CALCIUM 40 MG PO TABS: 40.0000 mg | ORAL_TABLET | Freq: Every day | ORAL | 0 refills | Status: AC

## 2018-03-03 MED ORDER — ASPIRIN EC 81 MG PO TBEC
81.0000 mg | DELAYED_RELEASE_TABLET | Freq: Every day | ORAL | Status: DC
  Administered 2018-03-03: 13:00:00 81 mg via ORAL
  Filled 2018-03-03: qty 1

## 2018-03-03 NOTE — Progress Notes (Signed)
*  PRELIMINARY RESULTS* Echocardiogram 2D Echocardiogram has been performed.  [place] 03/03/2018, 12:29 PM

## 2018-03-03 NOTE — Evaluation (Signed)
Occupational Therapy Evaluation Patient Details Name: [name] MRN: [id number] DOB: 1960/12/09 Today's Date: 03/03/2018    History of Present Illness Pt is a 58 y/o F with PMH HTN, DM, and CVA (2015) who presented to ED 1/24 d/t acute onset memory impairment and visual deficit. Pt with headache the night before, then morning of 1/24 reported no recollection of going into living room and sitting down and buzzing sound in her ears. Pt MRI of head with no acute intracranial abnormalities, chronic R medial PCA distribution infarct noted, stable.    Clinical Impression   Pt seen for OT evaluation this date. Prior to hospital admission, pt was I with ADLs including standing to shower and dressing, she was able to drive short distances and grocery shopped and prepped meals independently. Pt's sister lives with her in [facility].  She currently demonstrates no new functional impairments and required no assist from OT for BADLs or mobility.  Pt does endorse 1-2 instances of LOB within the home in past 6 months in which she had to lean on wall in her hallway as well as hx of falls following stroke in 2015. Pt educated on falls prevention strategies for safety in the home environment and verbalized understanding with OT. No OT follow up appears necessary at this time.     Follow Up Recommendations  No OT follow up    Equipment Recommendations  None recommended by OT    Recommendations for Other Services       Precautions / Restrictions Precautions Precautions: Fall Restrictions Weight Bearing Restrictions: No      Mobility Bed Mobility Overal bed mobility: Independent                Transfers Overall transfer level: Independent Equipment used: None                  Balance Overall balance assessment: Independent                                         ADL either performed or assessed with clinical judgement   ADL Overall ADL's : Independent                                        General ADL Comments: Pt demos ability to don slip on shoes while seated EOB, dons second gown to backside independently, stands sink side to clean neck/chest/face and dry with washcloth and towel with no LOB and no AD.      Vision Baseline Vision/History: Wears glasses Wears Glasses: Reading only Patient Visual Report: No change from baseline Vision Assessment?: Yes Eye Alignment: Within Functional Limits Ocular Range of Motion: Within Functional Limits Alignment/Gaze Preference: Within Defined Limits Tracking/Visual Pursuits: Able to track stimulus in all quads without difficulty Convergence: Within functional limits Visual Fields: No apparent deficits     Perception     Praxis      Pertinent Vitals/Pain Pain Assessment: 0-10 Pain Score: 4  Pain Location: lower back (chronic) Pain Descriptors / Indicators: Dull Pain Intervention(s): Limited activity within patient's tolerance     Hand Dominance     Extremity/Trunk Assessment Upper Extremity Assessment Upper Extremity Assessment: RUE deficits/detail;LUE deficits/detail RUE Deficits / Details: 4+/5 shld/elbow flex/ext and hand grip LUE Deficits / Details: 4-/5 shld/elbow flex/ext LUE Sensation:  decreased proprioception(when touching finger to nose to assess b/l coordination, slightly more difficulty requiring increased time to locate nose with L index finger. ) LUE Coordination: decreased gross motor   Lower Extremity Assessment Lower Extremity Assessment: Defer to PT evaluation   Cervical / Trunk Assessment Cervical / Trunk Assessment: Normal   Communication Communication Communication: No difficulties   Cognition Arousal/Alertness: Awake/alert Behavior During Therapy: WFL for tasks assessed/performed Overall Cognitive Status: Within Functional Limits for tasks assessed                                 General Comments: some slow speech, but appropriate in  all responses.    General Comments  Pt states L leg "feels heavier" slight deviation notable, but does not appear to effect functional task performance.    Exercises Other Exercises Other Exercises: fall prevention strategies discussed with pt including sitting when/if she experiences vision loss. Pt reports maybe 1-2 instances of LOB in the home within the past 6 months for which she was able to lean against wall and re-align in standing. OT educates pt on importance of checking in with body and taking a break, hydrating when she has these episodes to prevent fall adn further injury. Pt verbalized understanding.    Shoulder Instructions      Home Living Family/patient expects to be discharged to:: Private residence Living Arrangements: Other (Comment)(sister) Available Help at Discharge: Family Type of Home: House Home Access: Stairs to enter     Home Layout: One level     Bathroom Shower/Tub: Teacher, early years/pre: [place]: Grab bars - tub/shower;Cane - single point   Additional Comments: Pt states she does not typically use SPC out in community, occasionally uses at home.       Prior Functioning/Environment Level of Independence: Independent        Comments: Pt reports she would perform all ADLs independently, helps with some IADLs, was able to drive short distances and get her own groceries. States her son or sister would driver her longer distances such as going to the doctor which is a decision pt made for safety.         OT Problem List: Decreased strength;Decreased range of motion;Decreased activity tolerance;Impaired balance (sitting and/or standing)      OT Treatment/Interventions:      OT Goals(Current goals can be found in the care plan section) Acute Rehab OT Goals OT Goal Formulation: All assessment and education complete, DC therapy  OT Frequency:     Barriers to D/C:            Co-evaluation               AM-PAC OT "6 Clicks" Daily Activity     Outcome Measure Help from another person eating meals?: None Help from another person taking care of personal grooming?: None Help from another person toileting, which includes using toliet, bedpan, or urinal?: None Help from another person bathing (including washing, rinsing, drying)?: None Help from another person to put on and taking off regular upper body clothing?: None Help from another person to put on and taking off regular lower body clothing?: None 6 Click Score: 24   End of Session Equipment Utilized During Treatment: Gait belt Nurse Communication: Mobility status  Activity Tolerance: Patient tolerated treatment well Patient left: (sitting EOB)  OT Visit Diagnosis: Unsteadiness on feet (R26.81);Muscle weakness (generalized) (  M62.81)                Time: 0211-1552 OT Time Calculation (min): 25 min Charges:  OT Evaluation $OT Eval Moderate Complexity: 1 Mod OT Treatments $Self Care/Home Management : 8-22 mins  Gerrianne Scale, MS, OTR/L ascom [phone] or [phone] 03/03/18, 11:38 AM

## 2018-03-03 NOTE — Discharge Summary (Signed)
[place] at [place] NAME: [name]    MR#:  [id number]  DATE OF BIRTH:  26-May-1960  DATE OF ADMISSION:  03/02/2018 ADMITTING PHYSICIAN: Fritzi Mandes, MD  DATE OF DISCHARGE: 03/03/2018  2:57 PM  PRIMARY CARE PHYSICIAN: [facility] [place] DIAGNOSIS:  TIA (transient ischemic attack) [G45.9]  DISCHARGE DIAGNOSIS:  Presyncope  SECONDARY DIAGNOSIS:   Past Medical History:  Diagnosis Date  . Cancer ([place])   . Diabetes mellitus without complication ([place])   . Hypertension     HOSPITAL COURSE:   1.  Presyncope.  Patient feeling better today.  No arrhythmias on telemetry monitoring.  Echocardiogram looks okay.  MRI of the brain showed old stroke but nothing acute.  Patient was not orthostatic today.  Patient did have an ear infection.  Prescribed Augmentin. 2.  Otitis media.  Augmentin prescribed. 3.  Prior stroke.  Aspirin and statin prescribed. 4.  Hypertension metoprolol prescribed. 5.  Type 2 diabetes mellitus on metformin. 6.  Left hip and lower back pain.  Prescribed ibuprofen in the evening.  Aspirin in the morning. 7.  Depression prescribed Celexa 8.  GERD prescribed Protonix  DISCHARGE CONDITIONS:   Satisfactory  CONSULTS OBTAINED:  Treatment Team:  Catarina Hartshorn, MD  DRUG ALLERGIES:   Allergies  Allergen Reactions  . Doxycycline     Unsure of reaction.  But was told not to take any antibiodics with an ine at the end.   . Sulfur Rash    DISCHARGE MEDICATIONS:   Allergies as of 03/03/2018      Reactions   Doxycycline    Unsure of reaction.  But was told not to take any antibiodics with an ine at the end.    Sulfur Rash      Medication List    STOP taking these medications   HYDROcodone-acetaminophen 5-325 MG tablet Commonly known as:  NORCO/VICODIN   RABEprazole 20 MG tablet Commonly known as:  ACIPHEX     TAKE these medications   amoxicillin-clavulanate 875-125 MG  tablet Commonly known as:  AUGMENTIN Take 1 tablet by mouth every 12 (twelve) hours.   aspirin 81 MG EC tablet Take 1 tablet (81 mg total) by mouth daily.   atorvastatin 40 MG tablet Commonly known as:  LIPITOR Take 1 tablet (40 mg total) by mouth daily at 6 PM.   citalopram 20 MG tablet Commonly known as:  CELEXA Take 1 tablet (20 mg total) by mouth at bedtime.   ibuprofen 400 MG tablet Commonly known as:  ADVIL,MOTRIN Take 1 tablet (400 mg total) by mouth at bedtime.   loratadine 10 MG tablet Commonly known as:  CLARITIN Take 1 tablet (10 mg total) by mouth daily.   metFORMIN 500 MG tablet Commonly known as:  GLUCOPHAGE Take 1 tablet (500 mg total) by mouth 2 (two) times daily with a meal.   metoprolol tartrate 25 MG tablet Commonly known as:  LOPRESSOR Take 1 tablet (25 mg total) by mouth 2 (two) times daily.   omeprazole 20 MG capsule Commonly known as:  PRILOSEC Take 1 capsule (20 mg total) by mouth daily.        DISCHARGE INSTRUCTIONS:   Follow-up PMD 5 days  If you experience worsening of your admission symptoms, develop shortness of breath, life threatening emergency, suicidal or homicidal thoughts you must seek medical attention immediately by calling 911 or calling your MD immediately  if symptoms less severe.  You Must  read complete instructions/literature along with all the possible adverse reactions/side effects for all the Medicines you take and that have been prescribed to you. Take any new Medicines after you have completely understood and accept all the possible adverse reactions/side effects.   Please note  You were cared for by a hospitalist during your hospital stay. If you have any questions about your discharge medications or the care you received while you were in the hospital after you are discharged, you can call the unit and asked to speak with the hospitalist on call if the hospitalist that took care of you is not available. Once you are  discharged, your primary care physician will handle any further medical issues. Please note that NO REFILLS for any discharge medications will be authorized once you are discharged, as it is imperative that you return to your primary care physician (or establish a relationship with a primary care physician if you do not have one) for your aftercare needs so that they can reassess your need for medications and monitor your lab values.    Today   CHIEF COMPLAINT:   Chief Complaint  Patient presents with  . Weakness    HISTORY OF PRESENT ILLNESS:  [name]  is a 58 y.o. female with a known history of stroke presented with low back pain hip pain ear ache and headache   VITAL SIGNS:  Blood pressure (!) 160/80, pulse 62, temperature 98.4 F (36.9 C), temperature source Oral, resp. rate 16, height 5\' 4"  (1.626 m), weight 72.6 kg, SpO2 99 %.    PHYSICAL EXAMINATION:  GENERAL:  58 y.o.-year-old patient lying in the bed with no acute distress.  EYES: Pupils equal, round, reactive to light and accommodation. No scleral icterus. Extraocular muscles intact.  HEENT: Head atraumatic, normocephalic. Oropharynx and nasopharynx clear.  Tympanic membrane bulging and erythematous bilaterally NECK:  Supple, no jugular venous distention. No thyroid enlargement, no tenderness.  LUNGS: Normal breath sounds bilaterally, no wheezing, rales,rhonchi or crepitation. No use of accessory muscles of respiration.  CARDIOVASCULAR: S1, S2 normal. No murmurs, rubs, or gallops.  ABDOMEN: Soft, non-tender, non-distended. Bowel sounds present. No organomegaly or mass.  EXTREMITIES: No pedal edema, cyanosis, or clubbing.  Slight pain to palpation over the lumbar spine and left hip area NEUROLOGIC: Cranial nerves II through XII are intact. Muscle strength 5/5 in all extremities. Sensation intact. Gait not checked.  Able to straight leg raise bilaterally. PSYCHIATRIC: The patient is alert and oriented x 3.  SKIN: No  obvious rash, lesion, or ulcer.   DATA REVIEW:   CBC Recent Labs  Lab 03/02/18 1118  WBC 9.7  HGB 14.7  HCT 43.4  PLT 295    Chemistries  Recent Labs  Lab 03/02/18 1118  NA 138  K 3.9  CL 100  CO2 31  GLUCOSE 129*  BUN 14  CREATININE 0.87  CALCIUM 9.9  AST 13*  ALT 12  ALKPHOS 74  BILITOT 0.9    Cardiac Enzymes Recent Labs  Lab 03/02/18 1118  TROPONINI <0.03     RADIOLOGY:  Ct Angio Head W Or Wo Contrast  Result Date: 03/02/2018 CLINICAL DATA:  Headache and transient visual loss. EXAM: CT ANGIOGRAPHY HEAD AND NECK TECHNIQUE: Multidetector CT imaging of the head and neck was performed using the standard protocol during bolus administration of intravenous contrast. Multiplanar CT image reconstructions and MIPs were obtained to evaluate the vascular anatomy. Carotid stenosis measurements (when applicable) are obtained utilizing NASCET criteria, using the distal internal  carotid diameter as the denominator. CONTRAST:  58mL OMNIPAQUE IOHEXOL 350 MG/ML SOLN COMPARISON:  Head CT 03/02/2018 and MRI 09/27/2013. Carotid Doppler ultrasound 09/28/2013. FINDINGS: CTA NECK FINDINGS Aortic arch: Standard 3 vessel aortic arch with widely patent arch vessel origins. Mild nonstenotic plaque in the proximal left subclavian artery. Right carotid system: Patent with mixed calcified and soft plaque in the carotid bulb. No evidence of significant stenosis or dissection. Left carotid system: Patent with mixed calcified and soft plaque in the carotid bulb. No evidence of significant associated stenosis or dissection. Tortuous proximal ICA with kinked appearance. Vertebral arteries: The vertebral arteries are patent and codominant without significant stenosis on the left. There is a severe right vertebral artery origin stenosis. 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 Severe left facet arthrosis at C2-3. Advanced disc degeneration at C6-7 with bilateral neural foraminal stenosis due to  uncovertebral spurring. Other neck: No evidence of acute abnormality or mass. Upper chest: No apical lung consolidation or mass. Review of the MIP images confirms the above findings CTA HEAD FINDINGS Anterior circulation: The internal carotid arteries are patent from skull base to carotid termini with right greater than left siphon atherosclerosis resulting in moderate right cavernous and supraclinoid stenoses. ACAs and MCAs are patent without evidence of proximal branch occlusion. There are mild bilateral M1 stenoses. No aneurysm identified. Posterior circulation: The intracranial vertebral arteries are widely patent to the basilar. Patent PICA and SCA origins are identified bilaterally. The basilar artery is widely patent with a small fenestration incidentally noted proximally. The right PCA is occluded beginning in the proximal P2 segment with reconstitution of the distal P2 segment and more distal branches. The left PCA is patent with mild P1 and moderate P2 stenoses. No aneurysm is identified. Venous sinuses: Patent. Anatomic variants: None. Delayed phase: No abnormal enhancement.  Chronic right PCA infarct. Review of the MIP images confirms the above findings IMPRESSION: 1. Age advanced atherosclerosis. 2. Proximal right PCA occlusion with distal reconstitution in the setting of a chronic right PCA infarct. 3. Mild left P1 and moderate left P2 stenoses. 4. Moderate intracranial right ICA stenoses. 5. Severe right vertebral artery origin stenosis. 6. Cervical carotid artery atherosclerosis without significant stenosis. Aortic Atherosclerosis (ICD10-I70.0). Electronically Signed   By: Logan Bores M.D.   On: 03/02/2018 17:40   Ct Head Wo Contrast  Result Date: 03/02/2018 CLINICAL DATA:  58 year old female with headache since last night but transient loss of vision in both eyes this morning. Left upper extremity weakness, ataxia. EXAM: CT HEAD WITHOUT CONTRAST TECHNIQUE: Contiguous axial images were obtained  from the base of the skull through the vertex without intravenous contrast. COMPARISON:  Brain MRI and head CT 09/27/2013. FINDINGS: Brain: Medial right PCA territory chronic infarct with encephalomalacia. Elsewhere cerebral volume and gray-white differentiation appears stable negative; there is mild chronic nonspecific white matter hypodensity, mostly subcortical. No acute intracranial hemorrhage identified. No midline shift, mass effect, or evidence of intracranial mass lesion. No cortically based acute infarct identified. Vascular: Calcified atherosclerosis at the skull base. No suspicious intracranial vascular hyperdensity. Skull: Negative. Sinuses/Orbits: Mild chronic maxillary sinus mucosal thickening and bubbly opacity in the sphenoid. Tympanic cavities and mastoids are clear. Other: Negative orbits soft tissues. Visualized scalp soft tissues are within normal limits. IMPRESSION: No acute intracranial abnormality. Expected evolution of the 2015 medial right PCA territory infarct, and mild chronic cerebral white matter disease. Electronically Signed   By: Genevie Ann M.D.   On: 03/02/2018 12:21   Ct Angio Neck W  Or Wo Contrast  Result Date: 03/02/2018 CLINICAL DATA:  Headache and transient visual loss. EXAM: CT ANGIOGRAPHY HEAD AND NECK TECHNIQUE: Multidetector CT imaging of the head and neck was performed using the standard protocol during bolus administration of intravenous contrast. Multiplanar CT image reconstructions and MIPs were obtained to evaluate the vascular anatomy. Carotid stenosis measurements (when applicable) are obtained utilizing NASCET criteria, using the distal internal carotid diameter as the denominator. CONTRAST:  63mL OMNIPAQUE IOHEXOL 350 MG/ML SOLN COMPARISON:  Head CT 03/02/2018 and MRI 09/27/2013. Carotid Doppler ultrasound 09/28/2013. FINDINGS: CTA NECK FINDINGS Aortic arch: Standard 3 vessel aortic arch with widely patent arch vessel origins. Mild nonstenotic plaque in the  proximal left subclavian artery. Right carotid system: Patent with mixed calcified and soft plaque in the carotid bulb. No evidence of significant stenosis or dissection. Left carotid system: Patent with mixed calcified and soft plaque in the carotid bulb. No evidence of significant associated stenosis or dissection. Tortuous proximal ICA with kinked appearance. Vertebral arteries: The vertebral arteries are patent and codominant without significant stenosis on the left. There is a severe right vertebral artery origin stenosis. 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 Severe left facet arthrosis at C2-3. Advanced disc degeneration at C6-7 with bilateral neural foraminal stenosis due to uncovertebral spurring. Other neck: No evidence of acute abnormality or mass. Upper chest: No apical lung consolidation or mass. Review of the MIP images confirms the above findings CTA HEAD FINDINGS Anterior circulation: The internal carotid arteries are patent from skull base to carotid termini with right greater than left siphon atherosclerosis resulting in moderate right cavernous and supraclinoid stenoses. ACAs and MCAs are patent without evidence of proximal branch occlusion. There are mild bilateral M1 stenoses. No aneurysm identified. Posterior circulation: The intracranial vertebral arteries are widely patent to the basilar. Patent PICA and SCA origins are identified bilaterally. The basilar artery is widely patent with a small fenestration incidentally noted proximally. The right PCA is occluded beginning in the proximal P2 segment with reconstitution of the distal P2 segment and more distal branches. The left PCA is patent with mild P1 and moderate P2 stenoses. No aneurysm is identified. Venous sinuses: Patent. Anatomic variants: None. Delayed phase: No abnormal enhancement.  Chronic right PCA infarct. Review of the MIP images confirms the above findings IMPRESSION: 1. Age advanced atherosclerosis. 2. Proximal  right PCA occlusion with distal reconstitution in the setting of a chronic right PCA infarct. 3. Mild left P1 and moderate left P2 stenoses. 4. Moderate intracranial right ICA stenoses. 5. Severe right vertebral artery origin stenosis. 6. Cervical carotid artery atherosclerosis without significant stenosis. Aortic Atherosclerosis (ICD10-I70.0). Electronically Signed   By: Logan Bores M.D.   On: 03/02/2018 17:40   Mr Brain Wo Contrast  Result Date: 03/02/2018 CLINICAL DATA:  58 y/o F; transient vision loss. TIA, initial exam. EXAM: MRI HEAD WITHOUT CONTRAST TECHNIQUE: Multiplanar, multiecho pulse sequences of the brain and surrounding structures were obtained without intravenous contrast. COMPARISON:  03/02/2018 CT head and CTA head.  09/27/2013 MRI head. FINDINGS: Brain: No acute infarction, hemorrhage, hydrocephalus, extra-axial collection or mass lesion. Chronic right medial PCA distribution infarction. Several punctate nonspecific T2 FLAIR hyperintensities in subcortical and periventricular white matter are compatible with mild chronic microvascular ischemic changes. Mild volume loss of the brain. Vascular: Normal flow voids. Skull and upper cervical spine: Normal marrow signal. Sinuses/Orbits: Right maxillary sinus mucosal thickening with mucous retention cyst. No abnormal signal of mastoid air cells. Orbits are unremarkable. Other: None. IMPRESSION: 1. No  acute intracranial abnormality identified. 2. Chronic right medial PCA distribution infarct. Stable mild chronic microvascular ischemic changes and volume loss of the brain. Electronically Signed   By: Kristine Garbe M.D.   On: 03/02/2018 18:27     Management plans discussed with the patient, and she is in agreement.  CODE STATUS:     Code Status Orders  (From admission, onward)         Start     Ordered   03/02/18 1520  Full code  Continuous     03/02/18 1519        Code Status History    This patient has a current code  status but no historical code status.      TOTAL TIME TAKING CARE OF THIS PATIENT: 35 minutes.    Loletha Grayer M.D on 03/03/2018 at 3:00 PM  Between 7am to 6pm - Pager - [phone]  After 6pm go to [email or web address] - Proofreader  [employer] [facility]  [phone]  CC: Primary care physician; [facility]

## 2018-03-03 NOTE — Progress Notes (Signed)
Subjective: No further episodes  Exam: Vitals:   03/03/18 1054 03/03/18 1056  BP: (!) 182/76 (!) 160/80  Pulse: 61 62  Resp:    Temp:    SpO2: 99% 99%   Gen: In bed, NAD Resp: non-labored breathing, no acute distress Abd: soft, nt  Neuro: MS: Awake, alert, interactive and appropriate CN: Pupils equal round and reactive to light, visual fields full Motor: Extraocular movements intact Sensory: Intact light touch with the exception of 1 area over her right knee  Pertinent Labs: Elevated triglycerides  Impression: 58 year old female with a history of right PCA stroke who presents with signs and symptoms most consistent with presyncope.  She did appear to be orthostatic given that her blood pressure dropped from 299 systolic lying down to 371 systolic standing up.  She does need secondary stroke prevention measures given that she does have a history of a PCA stroke and I encouraged her to stop smoking.  An echocardiogram would be reasonable to evaluate presyncope.  I do not think she needs dual antiplatelet therapy given that I do not suspect that this was an acute neurological event.  I think that seizures are relatively unlikely, especially given that her episodes are similar to when she has had since age 48, long predating her PCA stroke.  Recommendations: 1) favor statin therapy given elevated lipids and intracranial atherosclerosis 2) antiplatelet therapy with aspirin 81 mg daily 3) echocardiogram 4) no further recommendations at this time, please call with further questions or concerns.  Roland Rack, MD [facility] [phone]  If 7pm- 7am, please page neurology on call as listed in [place].

## 2018-03-03 NOTE — Care Management Note (Signed)
Case Management Note  Patient Details  Name: [name] MRN: [id number] Date of Birth: 08/19/1960  Subjective/Objective:      Patient admitted with TIA, from home with sister. Independent from home.   Prior to hospitalization patient drove occasionally.  Discharging today.   Action/Plan:   Sister will transport home.  Denies difficulties with transportation.  Current with [facility].  She does not have insurance or any income.  Encouraged patient to finish Medicaid application she states she has started.  Obtains medications at [facility] as well.  No need for [facility] services.  Ambulates without assistance.  Discharging on Augmentin.   Printed a Web designer for $12.71 at [employer] and gave to patient. She states she can do this.  No further needs identified at this time.     Expected Discharge Date:  03/03/18               Expected Discharge Plan:  Home/Self Care  In-House Referral:     Discharge planning Services  CM Consult, [facility]  Post Acute Care Choice:    Choice offered to:     DME Arranged:    DME Agency:     HH Arranged:    HH Agency:     Status of Service:  Completed, signed off  If discussed at [employer], dates discussed:    Additional Comments:  Elza Rafter, RN 03/03/2018, 1:28 PM

## 2018-03-03 NOTE — Progress Notes (Signed)
SLP Cancellation Note  Patient Details Name: [name] MRN: [id number] DOB: [birth year]   Cancelled treatment:       Reason Eval/Treat Not Completed: SLP screened, no needs identified, will sign off(chart reviewed; consulted NSG then met w/ pt). Pt denied any difficulty swallowing and is currently on a regular diet; tolerates swallowing pills w/ water per NSG. Pt conversed at conversational level w/ SLP w/out deficits noted; pt and NSG denied any speech-language deficits. Pt c/o an earache - NSG informed. No further skilled ST services indicated as pt appears at her baseline. Pt agreed. NSG to reconsult if any change in status.     Orinda Kenner, MS, CCC-SLP Kory Rains 03/03/2018, 8:17 AM

## 2018-03-03 NOTE — Evaluation (Signed)
Physical Therapy Evaluation Patient Details Name: [name] MRN: [id number] DOB: 12-10-1960 Today's Date: 03/03/2018   History of Present Illness  Pt is a 58 y/o F with PMH HTN, DM, and CVA (2015) who presented to ED 1/24 d/t acute onset memory impairment and visual deficit. Pt with headache the night before, then morning of 1/24 reported no recollection of going into living room and sitting down and buzzing sound in her ears. Pt MRI of head with no acute intracranial abnormalities, chronic R medial PCA distribution infarct noted, stable.   Clinical Impression  Pt was able to walk on stairs and with no AD on the hall, demonstrating minor gait changes that per pt pre-existed her trip to hosp due to old stroke.  Follow along during her stay and plan to send to outpatient to work on back pain and L knee and hip dysfunction related to falls, medial collateral lig pain L knee and patellar crepitus with mal-alignment.  Pt is in agreement with all plans of her POC.     Follow Up Recommendations Outpatient PT    Equipment Recommendations  None recommended by PT    Recommendations for Other Services       Precautions / Restrictions Precautions Precautions: Fall Precaution Comments: L leg pain with patellar crepitus and L side lumbar pain(mult falls outdoors at home) Restrictions Weight Bearing Restrictions: No      Mobility  Bed Mobility Overal bed mobility: Modified Independent                Transfers Overall transfer level: Modified independent Equipment used: None                Ambulation/Gait Ambulation/Gait assistance: Supervision;Modified independent (Device/Increase time) Gait Distance (Feet): 300 Feet Assistive device: None Gait Pattern/deviations: Step-through pattern;Wide base of support;Decreased stride length Gait velocity: reduced Gait velocity interpretation: <1.31 ft/sec, indicative of household ambulator General Gait Details: had stroke affecting  L side in 2015, now is careful but not using AD  Stairs Stairs: Yes Stairs assistance: Supervision;Min guard Stair Management: One rail Right;Forwards;Alternating pattern;Step to pattern(alternating up and step to down) Number of Stairs: 7 General stair comments: able to navigate safely wiht careful use of rail  Wheelchair Mobility    Modified Rankin (Stroke Patients Only)       Balance Overall balance assessment: Independent                                           Pertinent Vitals/Pain Pain Assessment: 0-10 Pain Score: 4  Pain Location: lower back (chronic) Pain Descriptors / Indicators: Dull Pain Intervention(s): Monitored during session    Home Living Family/patient expects to be discharged to:: Private residence Living Arrangements: Other (Comment)(sister) Available Help at Discharge: Family Type of Home: House Home Access: Stairs to enter Entrance Stairs-Rails: Right;Left;Can reach both Entrance Stairs-Number of Steps: 3 Home Layout: One level Home Equipment: Grab bars - tub/shower;Cane - single point Additional Comments: has a walking stick she uses out in the field wiht her horses, but furniture walks at home    Prior Function Level of Independence: Independent         Comments: I with self care, drives only short trips     Hand Dominance   Dominant Hand: Right    Extremity/Trunk Assessment   Upper Extremity Assessment Upper Extremity Assessment: Defer to OT evaluation RUE Deficits / Details:  4+/5 shld/elbow flex/ext and hand grip LUE Deficits / Details: 4-/5 shld/elbow flex/ext LUE Sensation: decreased proprioception(when touching finger to nose to assess b/l coordination, slightly more difficulty requiring increased time to locate nose with L index finger. ) LUE Coordination: decreased gross motor    Lower Extremity Assessment Lower Extremity Assessment: Generalized weakness;LLE deficits/detail LLE Deficits / Details: hip  and knee are 4- to4, ankle WFL, RLE WFL LLE Coordination: decreased gross motor    Cervical / Trunk Assessment Cervical / Trunk Assessment: Normal  Communication   Communication: No difficulties  Cognition Arousal/Alertness: Awake/alert Behavior During Therapy: WFL for tasks assessed/performed Overall Cognitive Status: Within Functional Limits for tasks assessed                                 General Comments: some slow speech, but appropriate in all responses.       General Comments General comments (skin integrity, edema, etc.): pt has patellar catching on L knee to extend, and pain to move hip extending up to lumbar spine pain     Exercises Other Exercises Other Exercises: fall prevention strategies discussed with pt including sitting when/if she experiences vision loss. Pt reports maybe 1-2 instances of LOB in the home within the past 6 months for which she was able to lean against wall and re-align in standing. OT educates pt on importance of checking in with body and taking a break, hydrating when she has these episodes to prevent fall adn further injury. Pt verbalized understanding.    Assessment/Plan    PT Assessment Patient needs continued PT services  PT Problem List Decreased strength;Decreased range of motion;Decreased activity tolerance;Decreased balance;Decreased mobility;Decreased coordination;Decreased knowledge of use of DME;Decreased safety awareness;Decreased knowledge of precautions;Pain       PT Treatment Interventions DME instruction;Gait training;Stair training;Functional mobility training;Therapeutic activities;Therapeutic exercise;Balance training;Neuromuscular re-education;Patient/family education    PT Goals (Current goals can be found in the Care Plan section)  Acute Rehab PT Goals Patient Stated Goal: to get home and get back pain managed PT Goal Formulation: With patient Time For Goal Achievement: 03/17/18 Potential to Achieve Goals:  Good    Frequency Min 2X/week   Barriers to discharge Inaccessible home environment;Decreased caregiver support has stairs and is home alone    Co-evaluation               AM-PAC PT "6 Clicks" Mobility  Outcome Measure Help needed turning from your back to your side while in a flat bed without using bedrails?: None Help needed moving from lying on your back to sitting on the side of a flat bed without using bedrails?: None Help needed moving to and from a bed to a chair (including a wheelchair)?: None Help needed standing up from a chair using your arms (e.g., wheelchair or bedside chair)?: None Help needed to walk in hospital room?: A Little Help needed climbing 3-5 steps with a railing? : A Little 6 Click Score: 22    End of Session Equipment Utilized During Treatment: Gait belt Activity Tolerance: Patient tolerated treatment well Patient left: in bed;with call bell/phone within reach Nurse Communication: Mobility status PT Visit Diagnosis: Unsteadiness on feet (R26.81);Muscle weakness (generalized) (M62.81);Ataxic gait (R26.0)    Time: 1235-1250 PT Time Calculation (min) (ACUTE ONLY): 15 min   Charges:   PT Evaluation $PT Eval Moderate Complexity: 1 Mod         Ramond Dial 03/03/2018, 1:29 PM  Mee Hives, PT MS Acute Rehab Dept. Number: [place]

## 2018-03-05 LAB — LDL CHOLESTEROL, DIRECT: Direct LDL: 129 mg/dL — ABNORMAL HIGH (ref 0–99)

## 2019-12-10 ENCOUNTER — Emergency Department (HOSPITAL_COMMUNITY): Payer: Medicare Other

## 2019-12-10 ENCOUNTER — Emergency Department (HOSPITAL_COMMUNITY)
Admission: EM | Admit: 2019-12-10 | Discharge: 2019-12-11 | Discharge status: Against Medical Advice | Payer: Medicare Other | Attending: Emergency Medicine | Admitting: Emergency Medicine

## 2019-12-10 DIAGNOSIS — I10 Essential (primary) hypertension: Secondary | ICD-10-CM | POA: Diagnosis not present

## 2019-12-10 DIAGNOSIS — E860 Dehydration: Secondary | ICD-10-CM

## 2019-12-10 DIAGNOSIS — T7840XA Allergy, unspecified, initial encounter: Secondary | ICD-10-CM

## 2019-12-10 DIAGNOSIS — K047 Periapical abscess without sinus: Secondary | ICD-10-CM

## 2019-12-10 DIAGNOSIS — R739 Hyperglycemia, unspecified: Secondary | ICD-10-CM

## 2019-12-10 DIAGNOSIS — D72829 Elevated white blood cell count, unspecified: Secondary | ICD-10-CM

## 2019-12-10 DIAGNOSIS — F17210 Nicotine dependence, cigarettes, uncomplicated: Secondary | ICD-10-CM | POA: Diagnosis not present

## 2019-12-10 DIAGNOSIS — R55 Syncope and collapse: Secondary | ICD-10-CM | POA: Diagnosis not present

## 2019-12-10 DIAGNOSIS — R531 Weakness: Secondary | ICD-10-CM | POA: Insufficient documentation

## 2019-12-10 DIAGNOSIS — R21 Rash and other nonspecific skin eruption: Secondary | ICD-10-CM | POA: Insufficient documentation

## 2019-12-10 DIAGNOSIS — Z79899 Other long term (current) drug therapy: Secondary | ICD-10-CM | POA: Diagnosis not present

## 2019-12-10 DIAGNOSIS — R7989 Other specified abnormal findings of blood chemistry: Secondary | ICD-10-CM

## 2019-12-10 DIAGNOSIS — E119 Type 2 diabetes mellitus without complications: Secondary | ICD-10-CM | POA: Diagnosis not present

## 2019-12-10 LAB — RAPID URINE DRUG SCREEN, HOSP PERFORMED
Amphetamines: NOT DETECTED
Barbiturates: NOT DETECTED
Benzodiazepines: NOT DETECTED
Cocaine: NOT DETECTED
Opiates: NOT DETECTED
Tetrahydrocannabinol: NOT DETECTED

## 2019-12-10 LAB — CBC WITH DIFFERENTIAL/PLATELET
Abs Immature Granulocytes: 0 10*3/uL (ref 0.00–0.07)
Basophils Absolute: 0 10*3/uL (ref 0.0–0.1)
Basophils Relative: 0 %
Eosinophils Absolute: 0.3 10*3/uL (ref 0.0–0.5)
Eosinophils Relative: 1 %
HCT: 47.6 % — ABNORMAL HIGH (ref 36.0–46.0)
Hemoglobin: 15.9 g/dL — ABNORMAL HIGH (ref 12.0–15.0)
Lymphocytes Relative: 9 %
Lymphs Abs: 2.9 10*3/uL (ref 0.7–4.0)
MCH: 31.9 pg (ref 26.0–34.0)
MCHC: 33.4 g/dL (ref 30.0–36.0)
MCV: 95.4 fL (ref 80.0–100.0)
Monocytes Absolute: 1.3 10*3/uL — ABNORMAL HIGH (ref 0.1–1.0)
Monocytes Relative: 4 %
Neutro Abs: 27.9 10*3/uL — ABNORMAL HIGH (ref 1.7–7.7)
Neutrophils Relative %: 86 %
Platelets: 333 10*3/uL (ref 150–400)
RBC: 4.99 MIL/uL (ref 3.87–5.11)
RDW: 12.1 % (ref 11.5–15.5)
WBC: 32.4 10*3/uL — ABNORMAL HIGH (ref 4.0–10.5)
nRBC: 0 % (ref 0.0–0.2)
nRBC: 0 /100 WBC

## 2019-12-10 LAB — LACTIC ACID, PLASMA
Lactic Acid, Venous: 3.4 mmol/L (ref 0.5–1.9)
Lactic Acid, Venous: 3.5 mmol/L (ref 0.5–1.9)

## 2019-12-10 LAB — URINALYSIS, ROUTINE W REFLEX MICROSCOPIC
Bilirubin Urine: NEGATIVE
Glucose, UA: >=500 mg/dL — AB
Hgb urine dipstick: NEGATIVE
Ketones, ur: 5 mg/dL — AB
Nitrite: NEGATIVE
Protein, ur: NEGATIVE mg/dL
Specific Gravity, Urine: 1.012 (ref 1.005–1.030)
pH: 5 (ref 5.0–8.0)

## 2019-12-10 LAB — I-STAT BETA HCG BLOOD, ED (MC, WL, AP ONLY): I-stat hCG, quantitative: <5 m[IU]/mL (ref <5)

## 2019-12-10 LAB — COMPREHENSIVE METABOLIC PANEL
ALT: 20 U/L (ref 0–44)
AST: 22 U/L (ref 15–41)
Albumin: 3.8 g/dL (ref 3.5–5.0)
Alkaline Phosphatase: 70 U/L (ref 38–126)
Anion gap: 15 (ref 5–15)
BUN: 16 mg/dL (ref 6–20)
CO2: 22 mmol/L (ref 22–32)
Calcium: 9.5 mg/dL (ref 8.9–10.3)
Chloride: 99 mmol/L (ref 98–111)
Creatinine, Ser: 1.12 mg/dL — ABNORMAL HIGH (ref 0.44–1.00)
GFR, Estimated: 57 mL/min — ABNORMAL LOW (ref >60)
Glucose, Bld: 342 mg/dL — ABNORMAL HIGH (ref 70–99)
Potassium: 3.7 mmol/L (ref 3.5–5.1)
Sodium: 136 mmol/L (ref 135–145)
Total Bilirubin: 1 mg/dL (ref 0.3–1.2)
Total Protein: 6.7 g/dL (ref 6.5–8.1)

## 2019-12-10 LAB — MAGNESIUM: Magnesium: 1.8 mg/dL (ref 1.7–2.4)

## 2019-12-10 LAB — TSH: TSH: 1.47 u[IU]/mL (ref 0.350–4.500)

## 2019-12-10 LAB — CBG MONITORING, ED: Glucose-Capillary: 321 mg/dL — ABNORMAL HIGH (ref 70–99)

## 2019-12-10 MED ORDER — FAMOTIDINE IN NACL 20-0.9 MG/50ML-% IV SOLN
20.0000 mg | Freq: Once | INTRAVENOUS | Status: AC
Start: 2019-12-10 — End: 2019-12-10
  Administered 2019-12-10: 20 mg via INTRAVENOUS
  Filled 2019-12-10: qty 50

## 2019-12-10 MED ORDER — PIPERACILLIN-TAZOBACTAM 3.375 G IVPB 30 MIN: 3.3750 g | Freq: Once | INTRAVENOUS | Status: DC

## 2019-12-10 MED ORDER — METOPROLOL TARTRATE 25 MG PO TABS: 25.0000 mg | ORAL_TABLET | Freq: Two times a day (BID) | ORAL | 0 refills | Status: AC

## 2019-12-10 MED ORDER — SODIUM CHLORIDE 0.9 % IV BOLUS
1000.0000 mL | Freq: Once | INTRAVENOUS | Status: AC
  Administered 2019-12-10: 1000 mL via INTRAVENOUS

## 2019-12-10 MED ORDER — AMOXICILLIN 500 MG PO CAPS: 500.0000 mg | ORAL_CAPSULE | Freq: Three times a day (TID) | ORAL | 0 refills | Status: AC

## 2019-12-10 MED ORDER — DIPHENHYDRAMINE HCL 50 MG/ML IJ SOLN
25.0000 mg | Freq: Once | INTRAMUSCULAR | Status: AC
Start: 2019-12-10 — End: 2019-12-10
  Administered 2019-12-10: 25 mg via INTRAVENOUS
  Filled 2019-12-10: qty 1

## 2019-12-10 MED ORDER — METHYLPREDNISOLONE SODIUM SUCC 125 MG IJ SOLR
125.0000 mg | Freq: Once | INTRAMUSCULAR | Status: AC
Start: 2019-12-10 — End: 2019-12-10
  Administered 2019-12-10: 125 mg via INTRAVENOUS
  Filled 2019-12-10: qty 2

## 2019-12-10 MED ORDER — SODIUM CHLORIDE 0.9 % IV SOLN: INTRAVENOUS | Status: DC

## 2019-12-10 MED ORDER — METFORMIN HCL 500 MG PO TABS: 500.0000 mg | ORAL_TABLET | Freq: Two times a day (BID) | ORAL | 0 refills | Status: AC

## 2019-12-10 MED ORDER — INSULIN ASPART 100 UNIT/ML ~~LOC~~ SOLN
8.0000 [IU] | Freq: Once | SUBCUTANEOUS | Status: AC
  Administered 2019-12-10: 8 [IU] via SUBCUTANEOUS

## 2019-12-10 MED ORDER — SODIUM CHLORIDE 0.9 % IV BOLUS
1000.0000 mL | Freq: Once | INTRAVENOUS | Status: AC
Start: 2019-12-10 — End: 2019-12-10
  Administered 2019-12-10: 1000 mL via INTRAVENOUS

## 2019-12-10 NOTE — ED Notes (Signed)
Multiple attempts to call patient via listed phone numbers.  Phones are not in service.  Patient left with IV in place per [facility].  Security notified to try and find patient out in parking lot.

## 2019-12-10 NOTE — ED Provider Notes (Addendum)
Signed out by Dr Sherry Ruffing to d/c to home when labs resulted.   On review  additional labs, wbc is high. Lactate mildly elevated. Pt notes recently started on keflex for suspected dental abscess, and has f/u already arranged. States mild pain to upper and lower tooth, and mild gum swelling - no acute or abrupt worsening but rather persistence of symptoms. Denies fever/chills. No sore throat or cough. No trouble breathing or swallowing. Denies headache. No neck pain or stiffness. No ear pain. No chest pain or sob. No abd pain or nvd. No dysuria or gu c/o. No current rash or skin lesions. No extremity pain or swelling. No back/flank pain.   Pt has no neck stiffness or rigidity. Pharynx is normal. +dental decay and possible dental abscess. No trismus. No facial redness or swelling noted. No sinus tenderness. No neck pain, swelling or tenderness. Trachea midline. No l/a. Breathing comfortably. No cough or increased wob. Abd soft nt.   Pt denies any numbness/weakness. No change in speech or vision. Has been ambulatory to bathroom and about room with no faintness or dizziness.   Pt does indicate she has been out of her meds, including metoprolol and metformin, for the past couple of months.  Blood sugar is high, appears dehydrated, hco3 normal. Iv ns bolus. Novolog sq.   Today's Vitals   12/10/19 1815 12/10/19 1830 12/10/19 1900 12/10/19 1905  BP: (!) 157/85 138/78 (!) 158/81 (!) 158/81  Pulse: 91 86 (!) 53 82  Resp: (!) 21 19 20 16   Temp:    98.1 F (36.7 C)  TempSrc:    Oral  SpO2: 97% 96% (!) 76% 99%   Repeat temp - remains afebrile.   Pt indicates she will inform her doctors of possible rxn to keflex, and not take in future. She indicates she can take amoxicillin without adverse rxn.  Given syncope, neuro symptoms, hyperglycemia with ketones in urine, elevated lactate, and repeat lactate post ivf still elevated - will admit for observation, ivf, diabetes management, monitoring. Blood cultures and  additional lab added. Will give dose antibiotic, zosyn ordered. Pt continues to deny new pain or other source of infection, other than recent dx suspected dental abscess. Denies headache. No cp or sob. no abd pain or tenderness on exam. No gu c/o.   Hospitalists consulted for admission.   Patient was initially hesitant to stay for admission. I re-discussed reasons for admission, markedly elevated wbc and increasing lactate despite ivf, and concern for infection/sepsis, I also discussed recent period non-compiance w meds and now hyperglycemia with ketones in urine/dehydration/?mild dka, and need for ivf/insulin/diabetes teaching/meds. I also discussed monitoring heart rhythm for earlier syncope, and need to monitor for possible rhythm problem w heart. I discussed risks for leaving including worsening dehydration, severe infection, fainting, heart heart problems, and possible death related to above issues - pt indicated she would think about staying, and I gave her a few minutes to discuss w family (family/son was in room during one of the discussions w pt rec admission/risks, etc.).  On re-entering room, patient has left ED and hospital AMA without notifying me/staff - checked waiting room, pt last left hospital.   Patient left ED/hospital AMA prior to recommended additional treatment, and admission.       Lajean Saver, MD 12/10/19 [phone]

## 2019-12-10 NOTE — Discharge Instructions (Addendum)
It was our pleasure to provide your ER care today - we hope that you feel better.  Return if reconsider and are willing to be admitted and/or have further testing/treatment done.  Rest. Drink plenty of fluids/water. Follow diabetes eating plan. Check sugar 4x/day (before meals and at night) and record values. Follow up with diabetes management services in the next 1-2 days.   Stop the keflex and inform your doctors in future of the suspected allergic reaction.   For dental issue, take amoxicillin as prescribed. Take acetaminophen as need. Follow up with your dental doctor as planned.   From today's lab tests, your white blood count is high and blood sugar is high - drink plenty of fluids/water, follow diabetes eating plan, continue your medication, and follow up with your doctor tomorrow - call office in AM tomorrow to arrange close follow up appointment. Also have your blood pressure rechecked, as it is high today.   Return to ER right away if reconsider and agreeable to further treatment/admission, or if worse, new symptoms, fainting, chest pain, trouble breathing, new or severe pain, severe headache, high fevers, facial swelling/redness, numbness/weakness, change in speech or vision, or other concern.

## 2019-12-10 NOTE — ED Notes (Signed)
Patient ambulated successfully to restroom successfully.

## 2019-12-10 NOTE — ED Notes (Signed)
Pt eloped prior to tx completion, charge nurse and security notified.

## 2019-12-10 NOTE — ED Provider Notes (Signed)
[facility]PART[facility] Provider Note   CSN: [id number] Arrival date & time: 12/10/19  1506     History Chief Complaint  Patient presents with  . Weakness    [name] is a 59 y.o. female.  The history is provided by the patient and medical records. No language interpreter was used.  Allergic Reaction Presenting symptoms: itching and rash   Presenting symptoms: no difficulty breathing, no difficulty swallowing and no wheezing   Presenting symptoms comment:  Syncope Severity:  Moderate Prior allergic episodes:  Allergies to medications Context: medications   Relieved by:  Nothing Worsened by:  Nothing Ineffective treatments:  None tried Loss of Consciousness Episode history:  Single Most recent episode:  Today Timing:  Constant Progression:  Resolved Chronicity:  Recurrent Relieved by:  Nothing Worsened by:  Nothing Ineffective treatments:  None tried Associated symptoms: diaphoresis, focal weakness (resolving) and malaise/fatigue   Associated symptoms: no chest pain, no confusion, no difficulty breathing, no fever, no headaches, no nausea, no palpitations, no recent injury, no shortness of breath, no vomiting and no weakness        Past Medical History:  Diagnosis Date  . Cancer ([place])   . Diabetes mellitus without complication ([place])   . Hypertension     Patient Active Problem List   Diagnosis Date Noted  . TIA (transient ischemic attack) 03/02/2018    Past Surgical History:  Procedure Laterality Date  . ABDOMINAL HYSTERECTOMY    . SKIN CANCER EXCISION       OB History   No obstetric history on file.     No family history on file.  Social History   Tobacco Use  . Smoking status: Current Every Day Smoker  Substance Use Topics  . Alcohol use: No  . Drug use: Not on file    Home Medications Prior to Admission medications   Medication Sig Start Date End Date Taking? Authorizing Provider  amoxicillin-clavulanate  (AUGMENTIN) 875-125 MG tablet Take 1 tablet by mouth every 12 (twelve) hours. 03/03/18   Loletha Grayer, MD  aspirin EC 81 MG EC tablet Take 1 tablet (81 mg total) by mouth daily. 03/03/18   Loletha Grayer, MD  atorvastatin (LIPITOR) 40 MG tablet Take 1 tablet (40 mg total) by mouth daily at 6 PM. 03/03/18   Loletha Grayer, MD  citalopram (CELEXA) 20 MG tablet Take 1 tablet (20 mg total) by mouth at bedtime. 03/03/18   Loletha Grayer, MD  ibuprofen (ADVIL,MOTRIN) 400 MG tablet Take 1 tablet (400 mg total) by mouth at bedtime. 03/03/18   Loletha Grayer, MD  loratadine (CLARITIN) 10 MG tablet Take 1 tablet (10 mg total) by mouth daily. 03/03/18   Loletha Grayer, MD  metFORMIN (GLUCOPHAGE) 500 MG tablet Take 1 tablet (500 mg total) by mouth 2 (two) times daily with a meal. 03/03/18   Loletha Grayer, MD  metoprolol tartrate (LOPRESSOR) 25 MG tablet Take 1 tablet (25 mg total) by mouth 2 (two) times daily. 03/03/18   Loletha Grayer, MD  omeprazole (PRILOSEC) 20 MG capsule Take 1 capsule (20 mg total) by mouth daily. 03/03/18 03/03/19  Loletha Grayer, MD    Allergies    Doxycycline and Sulfur  Review of Systems   Review of Systems  Constitutional: Positive for diaphoresis, fatigue and malaise/fatigue. Negative for chills and fever.  HENT: Negative for congestion and trouble swallowing.   Eyes: Negative for visual disturbance.  Respiratory: Negative for cough, chest tightness, shortness of breath and wheezing.  Cardiovascular: Positive for syncope. Negative for chest pain and palpitations.  Gastrointestinal: Negative for abdominal pain, constipation, diarrhea, nausea and vomiting.  Genitourinary: Negative for difficulty urinating, dysuria, flank pain, frequency, vaginal bleeding and vaginal discharge.  Musculoskeletal: Negative for back pain, neck pain and neck stiffness.  Skin: Positive for itching and rash. Negative for wound.  Neurological: Positive for focal weakness (resolving),  syncope and light-headedness. Negative for speech difficulty, weakness, numbness and headaches.  Psychiatric/Behavioral: Negative for agitation and confusion.  All other systems reviewed and are negative.   Physical Exam Updated Vital Signs BP 133/69 (BP Location: Right Arm)   Pulse 97   Temp 98.6 F (37 C) (Oral)   Resp 13   SpO2 96%   Physical Exam Vitals and nursing note reviewed.  Constitutional:      General: She is not in acute distress.    Appearance: She is well-developed. She is not ill-appearing, toxic-appearing or diaphoretic.  HENT:     Head: Normocephalic and atraumatic.     Right Ear: External ear normal.     Left Ear: External ear normal.     Nose: Nose normal. No congestion or rhinorrhea.     Mouth/Throat:     Mouth: Mucous membranes are dry.     Pharynx: No oropharyngeal exudate or posterior oropharyngeal erythema.  Eyes:     Extraocular Movements: Extraocular movements intact.     Conjunctiva/sclera: Conjunctivae normal.     Pupils: Pupils are equal, round, and reactive to light.  Cardiovascular:     Rate and Rhythm: Normal rate.     Pulses: Normal pulses.     Heart sounds: No murmur heard.   Pulmonary:     Effort: Pulmonary effort is normal. No respiratory distress.     Breath sounds: No stridor. No wheezing, rhonchi or rales.  Chest:     Chest wall: No tenderness.  Abdominal:     General: Abdomen is flat. There is no distension.     Tenderness: There is no abdominal tenderness. There is no right CVA tenderness, left CVA tenderness, guarding or rebound.  Musculoskeletal:        General: No tenderness.     Cervical back: Normal range of motion and neck supple. No tenderness.  Skin:    General: Skin is warm.     Coloration: Skin is not pale.     Findings: Erythema and rash present.  Neurological:     General: No focal deficit present.     Mental Status: She is alert and oriented to person, place, and time.     Cranial Nerves: No cranial nerve  deficit.     Sensory: No sensory deficit.     Motor: Weakness (R leg weakness, resolved on raessessment) present. No abnormal muscle tone.     Coordination: Coordination normal.     Deep Tendon Reflexes: Reflexes are normal and symmetric.  Psychiatric:        Mood and Affect: Mood normal.     ED Results / Procedures / Treatments   Labs (all labs ordered are listed, but only abnormal results are displayed) Labs Reviewed  CBG MONITORING, ED - Abnormal; Notable for the following components:      Result Value   Glucose-Capillary 321 (*)    All other components within normal limits  CBC WITH DIFFERENTIAL/PLATELET  COMPREHENSIVE METABOLIC PANEL  MAGNESIUM  TSH  RAPID URINE DRUG SCREEN, HOSP PERFORMED  I-STAT BETA HCG BLOOD, ED ([facility], [facility], [facility] ONLY)    EKG EKG  Interpretation  Date/Time:  Tuesday December 10 2019 15:09:02 EDT Ventricular Rate:  98 PR Interval:    QRS Duration: 91 QT Interval:  388 QTC Calculation: 496 R Axis:   4 Text Interpretation: Sinus rhythm Minimal ST depression, lateral leads Borderline prolonged QT interval when compared to prior, longer QTC. No STEMI Confirmed by Antony Blackbird [id number]) on 12/10/2019 3:20:38 PM   Radiology DG Chest Port 1 View  Result Date: 12/10/2019 CLINICAL DATA:  Syncope, allergic reaction EXAM: PORTABLE CHEST 1 VIEW COMPARISON:  04/09/2012 FINDINGS: The heart size and mediastinal contours are within normal limits. Both lungs are clear. The visualized skeletal structures are unremarkable. IMPRESSION: No active disease. Electronically Signed   By: Randa Ngo M.D.   On: 12/10/2019 16:11    Procedures Procedures (including critical care time)  CRITICAL CARE Performed by: Gwenyth Allegra Brylan Dec Total critical care time: 20 minutes Critical care time was exclusive of separately billable procedures and treating other patients. Critical care was necessary to treat or prevent imminent or life-threatening deterioration. Critical care was  time spent personally by me on the following activities: development of treatment plan with patient and/or surrogate as well as nursing, discussions with consultants, evaluation of patient's response to treatment, examination of patient, obtaining history from patient or surrogate, ordering and performing treatments and interventions, ordering and review of laboratory studies, ordering and review of radiographic studies, pulse oximetry and re-evaluation of patient's condition.   Medications Ordered in ED Medications  sodium chloride 0.9 % bolus 1,000 mL (1,000 mLs Intravenous New Bag/Given 12/10/19 1624)    And  0.9 %  sodium chloride infusion (has no administration in time range)  diphenhydrAMINE (BENADRYL) injection 25 mg (25 mg Intravenous Given 12/10/19 1625)  methylPREDNISolone sodium succinate (SOLU-MEDROL) 125 mg/2 mL injection 125 mg (125 mg Intravenous Given 12/10/19 1625)  famotidine (PEPCID) IVPB 20 mg premix (20 mg Intravenous New Bag/Given 12/10/19 1625)    ED Course  I have reviewed the triage vital signs and the nursing notes.  Pertinent labs & imaging results that were available during my care of the patient were reviewed by me and considered in my medical decision making (see chart for details).    MDM Rules/Calculators/A&P                          [name] is a 59 y.o. female with a past medical history significant for hypertension, diabetes, prior stroke/TIA, and known left dental infection scheduled to see dentistry in the next few weeks who presents with allergic reaction and syncopal episode.  Patient reports that she is scheduled to see dentistry later this month for dental procedure to remove some teeth that are infected.  She reports that the pain was hurting the last couple days so she took a friend's antibiotic.  She was unsure what it was but think it was a green pill like Keflex.  She says it was 500 mg.  She says that after taking it, she started having allergic  reaction and felt hot all over, got urticarial rash, and a syncopal episode.  She denied chest pain, palpitations, shortness of breath but after waking up from her syncopal episode, she reportedly was having difficulty speaking, some right leg weakness, and was feeling agitated all over.  Upon arrival to the emergency department, symptoms have improved which is still red all over and feels the rash.  She denies any sensation of throat closing or throat swelling.  She  reported tingling all over.  She denies any headache, neck pain, or neck stiffness.  Denies any chest pain, palpitations, shortness of breath.  She denies wheezing.  She denies nausea, vomiting, urinary or GI symptoms.  On my initial evaluation, patient was demonstrating some right leg weakness compared to left.  She did not have any sensory deficit throughout with no numbness in her arm, face, or leg.  There was no facial droop.  Lungs were clear and chest was nontender abdomen was nontender.  Normal finger-nose-finger testing bilaterally.  Pupils are symmetric and reactive normal extraocular movements.  Normal sensation of the face.  Patient had some dental abnormalities in her left lower jaw but did not have significant tenderness otherwise.  No carotid bruit appreciated.  Normal neck range of motion.  Patient was in a c-collar after the fall but denies any neck pain or neck stiffness.  Patient is denying any headaches.  Given the weakness on exam which started at 1 PM, history of stroke, I discussed I had concern for possible stroke or TIA.  Patient reports that the weakness is now improved and she could raise her leg without difficulty and she does not want a stroke work-up time.  Patient would rather get medications to go with allergic reaction from the Keflex she thinks she took.  We had a shared decision-making conversation and will hold on any neurologic work-up but we will give her steroids, antihistamines, fluids, and get some screening  labs and a chest x-ray with a syncopal episode.  If work-up is reassuring, patient would like to go home and avoid Keflex in the future.  She still does not want a neurologic work-up during her ED evaluation.  Anticipate reassessment after work-up is completed and she gets the allergic reaction medications.  Given her lack of any airway compromise, shortness of breath, difficulty swallowing, or difficulty breathing, will hold on epinephrine at this time.  Care transferred oncoming team while awaiting reassessment and lab work-up.   Final Clinical Impression(s) / ED Diagnoses Final diagnoses:  Allergic reaction, initial encounter  Syncope, unspecified syncope type     Clinical Impression: 1. Allergic reaction, initial encounter   2. Syncope, unspecified syncope type     Disposition: Care transferred to oncoming team awaiting for results of work-up and reassessment after allergic reaction medications.  If work-up is reassuring, and symptoms improved, anticipate discharge.  This note was prepared with assistance of Systems analyst. Occasional wrong-word or sound-a-like substitutions may have occurred due to the inherent limitations of voice recognition software.     Maleka Contino, Gwenyth Allegra, MD 12/10/19 [phone]
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
## 2020-03-21 IMAGING — MR MR HEAD W/O CM
9 of 10 series · 43 of 48 positions shown · non-contrast
Comparison: 03/02/2018 CT head and CTA head.  09/27/2013 MRI head.

CLINICAL DATA: 57 y/o F; transient vision loss. TIA, initial exam.

EXAM:
MRI HEAD WITHOUT CONTRAST
TECHNIQUE: Multiplanar, multiecho pulse sequences of the brain and surrounding
structures were obtained without intravenous contrast.

[Series 2: ax dwi_tracew · axial · 3.0mm · 0.83mm/px · z∈[-79,+83]mm · 7 of 55 slices shown]
[im 1/55]
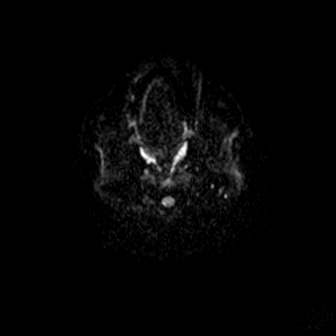
[im 10/55]
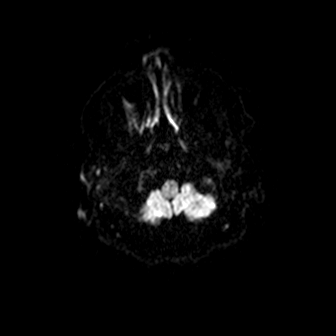
[im 19/55]
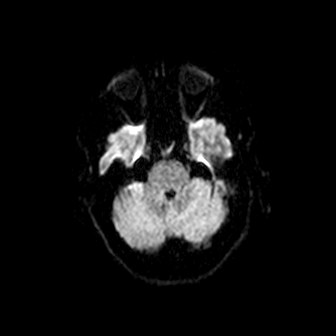
[im 28/55]
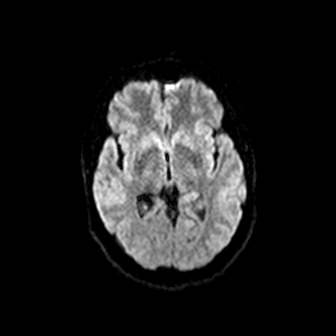
[im 37/55]
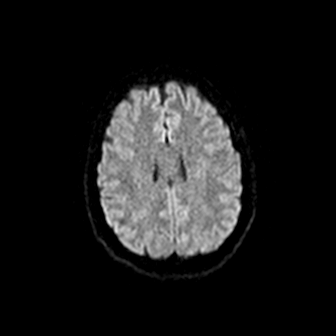
[im 46/55]
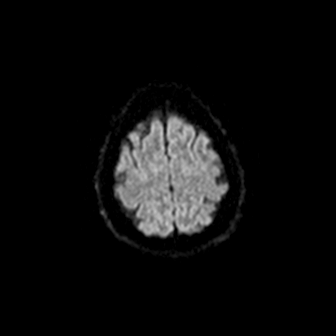
[im 55/55]
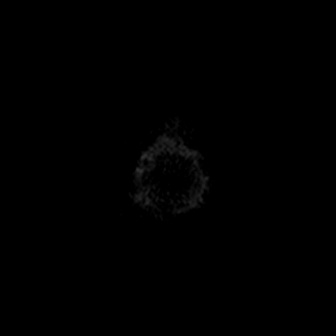

[Series 3: ax dwi_adc · axial · 3.0mm · 0.83mm/px · z∈[-79,+83]mm · 8 of 55 slices shown]
[im 1/55]
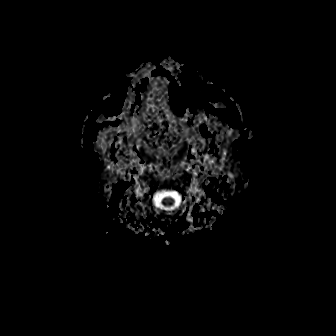
[im 8/55]
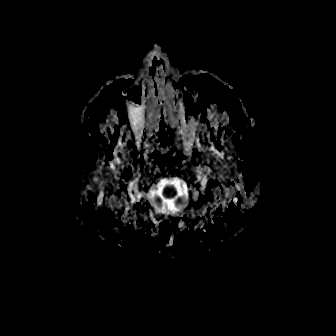
[im 16/55]
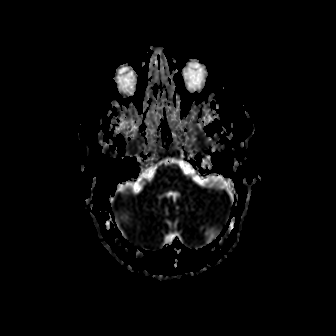
[im 24/55]
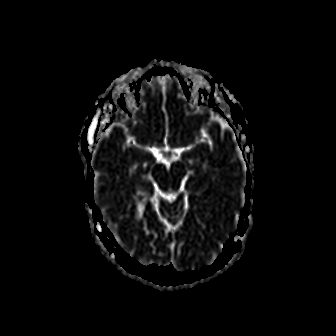
[im 31/55]
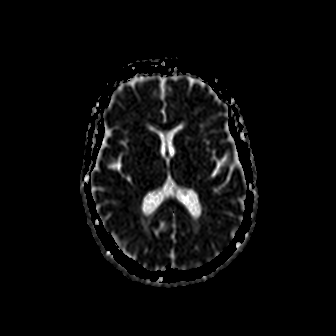
[im 39/55]
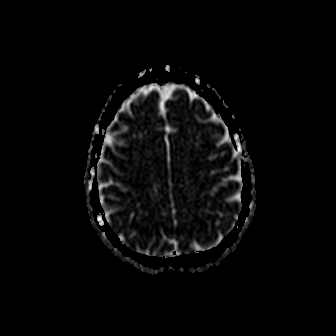
[im 47/55]
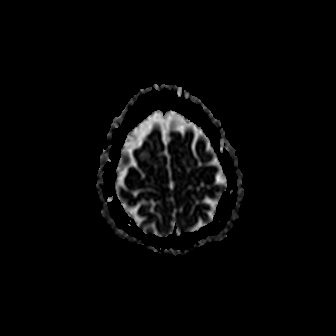
[im 55/55]
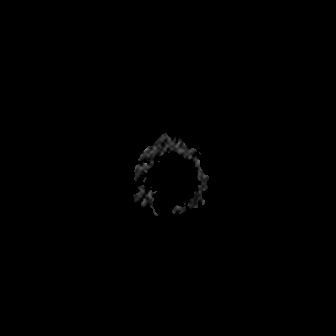

[Series 4: cor dwi_tracew · coronal · 5.0mm · 0.68mm/px · 5 of 39 slices shown]
[im 1/39]
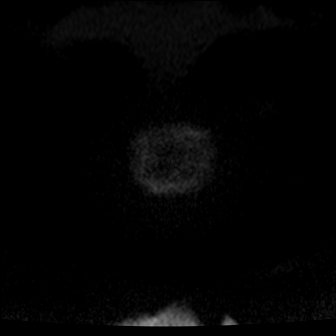
[im 10/39]
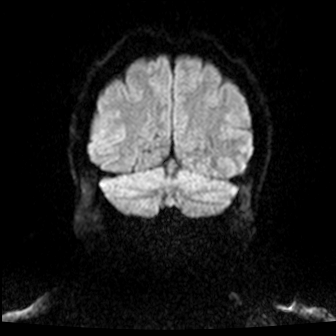
[im 20/39]
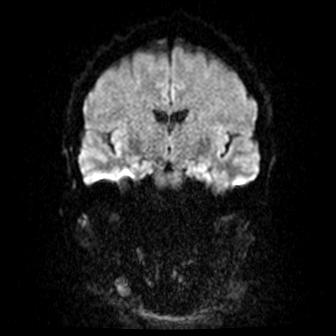
[im 29/39]
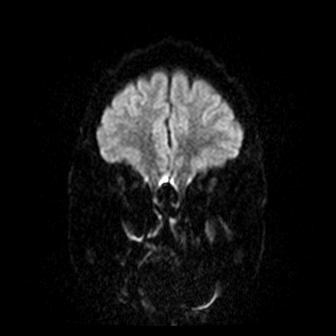
[im 39/39]
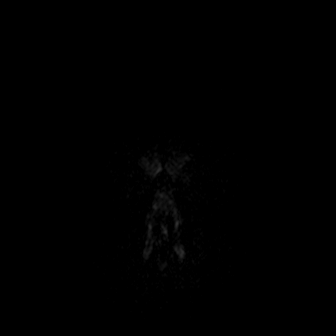

[Series 5: cor dwi_adc · coronal · 5.0mm · 0.68mm/px · 4 of 39 slices shown]
[im 1/39]
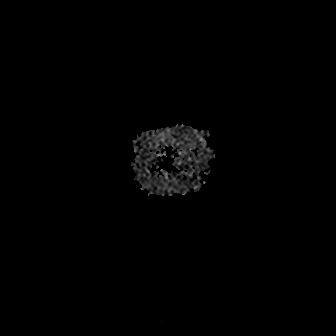
[im 10/39]
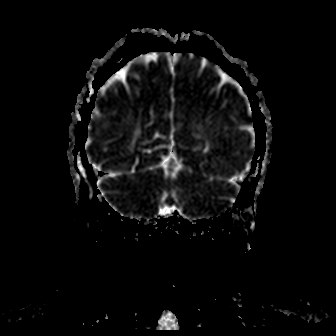
[im 20/39]
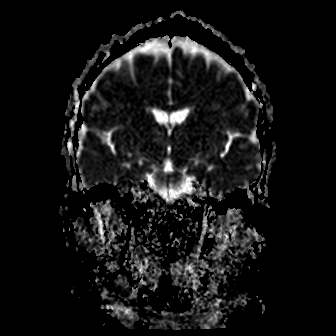
[im 29/39]
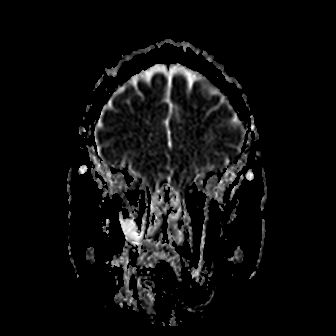

[Series 6: FLAIR · axial · 5.0mm · 1.20mm/px · z∈[-76,+80]mm · 4 of 27 slices shown]
[im 1/27]
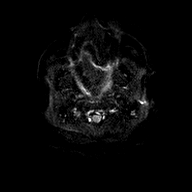
[im 9/27]
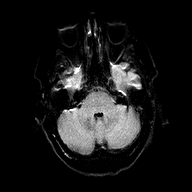
[im 18/27]
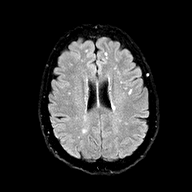
[im 27/27]
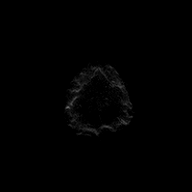

[Series 8: T2 · axial · 5.0mm · 0.45mm/px · z∈[-76,+80]mm · 4 of 27 slices shown (1 of 2)]
[im 1/27]
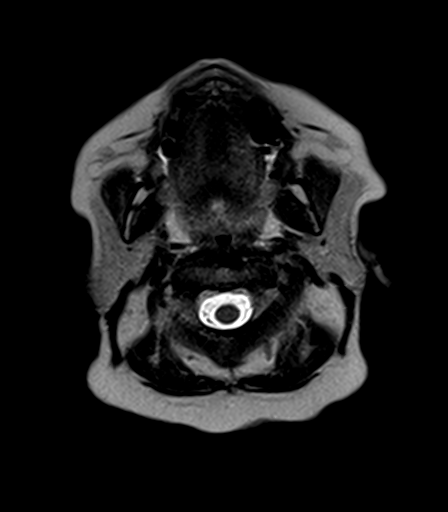
[im 9/27]
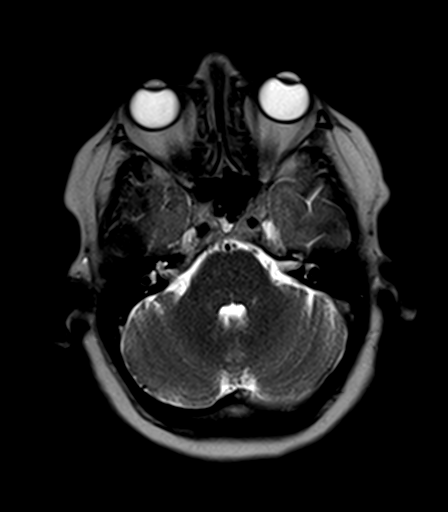
[im 18/27]
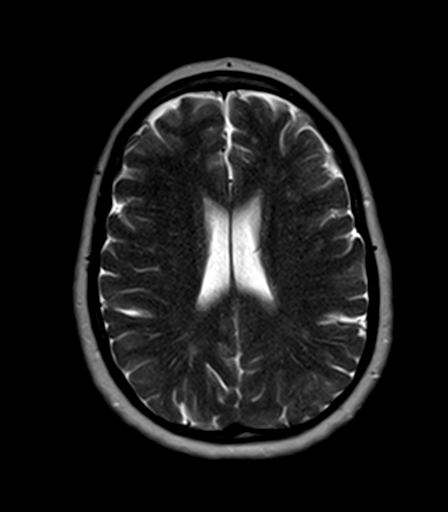
[im 27/27]
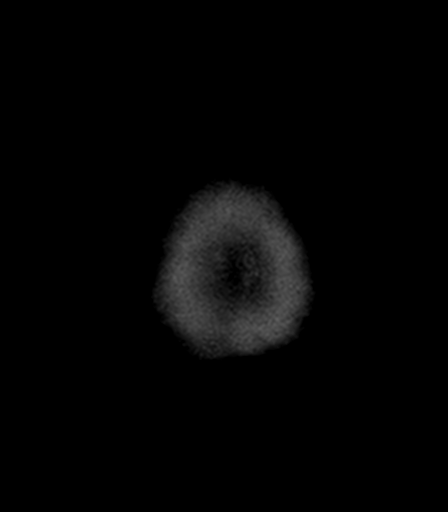

[Series 9: T1 · axial · 5.0mm · 0.90mm/px · z∈[-76,+80]mm · 4 of 27 slices shown (1 of 2)]
[im 1/27]
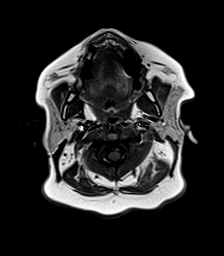
[im 9/27]
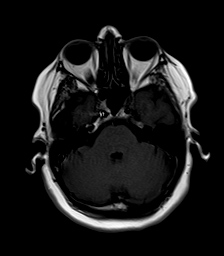
[im 18/27]
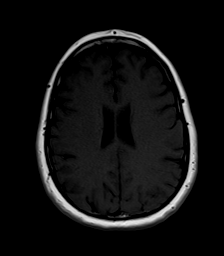
[im 27/27]
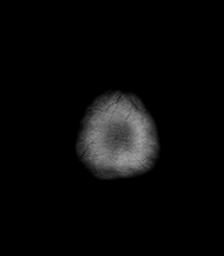

[Series 10: T1 · sagittal · 5.0mm · 0.94mm/px · 3 of 21 slices shown (2 of 2)]
[im 1/21]
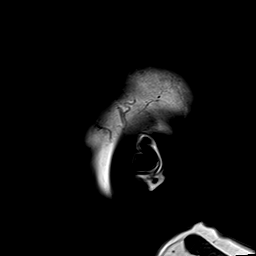
[im 11/21]
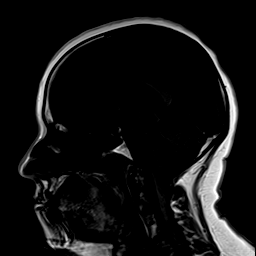
[im 21/21]
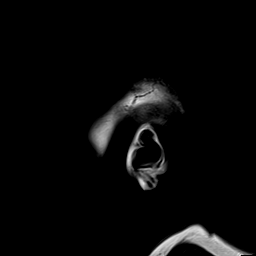

[Series 11: T2 · coronal · 5.0mm · 0.45mm/px · 4 of 31 slices shown (2 of 2)]
[im 1/31]
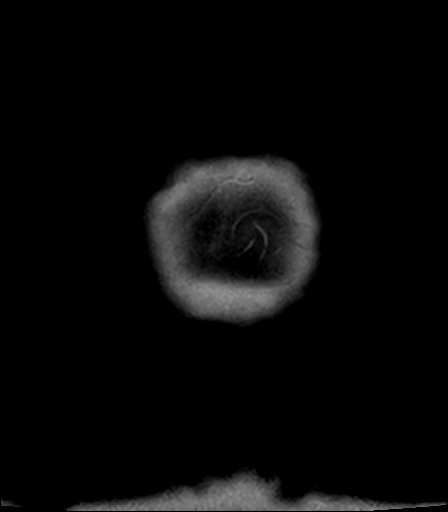
[im 11/31]
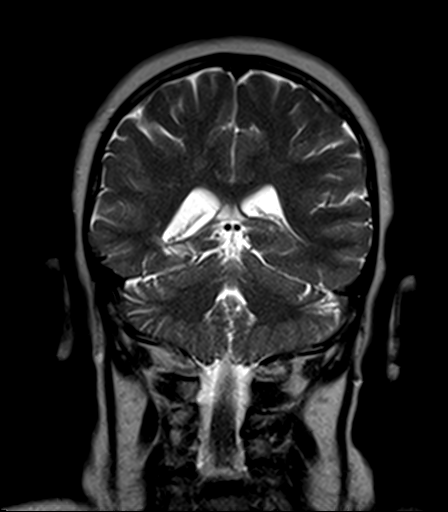
[im 21/31]
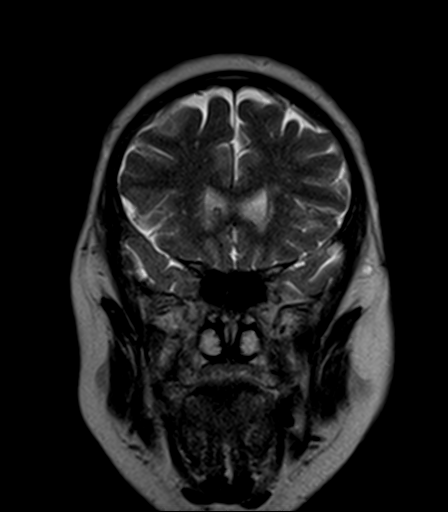
[im 31/31]
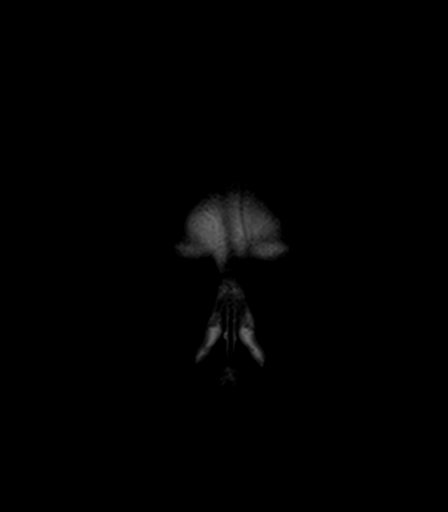

[43 of 48 positions shown; findings below may reference images not displayed]

FINDINGS: Brain: No acute infarction, hemorrhage, hydrocephalus, extra-axial
collection or mass lesion. Chronic right medial PCA distribution
infarction. Several punctate nonspecific T2 FLAIR hyperintensities
in subcortical and periventricular white matter are compatible with
mild chronic microvascular ischemic changes. Mild volume loss of the
brain.

Vascular: Normal flow voids.

Skull and upper cervical spine: Normal marrow signal.

Sinuses/Orbits: Right maxillary sinus mucosal thickening with mucous
retention cyst. No abnormal signal of mastoid air cells. Orbits are
unremarkable.

Other: None.
IMPRESSION: 1. No acute intracranial abnormality identified.
2. Chronic right medial PCA distribution infarct. Stable mild
chronic microvascular ischemic changes and volume loss of the brain.

## 2021-04-19 ENCOUNTER — Other Ambulatory Visit (HOSPITAL_COMMUNITY): Payer: Self-pay | Admitting: Family Medicine

## 2021-04-19 DIAGNOSIS — Z1231 Encounter for screening mammogram for malignant neoplasm of breast: Secondary | ICD-10-CM

## 2021-05-12 ENCOUNTER — Other Ambulatory Visit: Payer: Self-pay | Admitting: Family Medicine

## 2021-05-12 DIAGNOSIS — Z1231 Encounter for screening mammogram for malignant neoplasm of breast: Secondary | ICD-10-CM

## 2021-12-01 ENCOUNTER — Emergency Department: Payer: Medicare Other

## 2021-12-01 ENCOUNTER — Emergency Department
Admission: EM | Admit: 2021-12-01 | Discharge: 2021-12-01 | Disposition: A | Discharge status: Home / Self Care | Payer: Medicare Other | Attending: Emergency Medicine | Admitting: Emergency Medicine

## 2021-12-01 ENCOUNTER — Other Ambulatory Visit: Payer: Self-pay

## 2021-12-01 DIAGNOSIS — S20219A Contusion of unspecified front wall of thorax, initial encounter: Secondary | ICD-10-CM | POA: Insufficient documentation

## 2021-12-01 DIAGNOSIS — S29001A Unspecified injury of muscle and tendon of front wall of thorax, initial encounter: Secondary | ICD-10-CM | POA: Diagnosis present

## 2021-12-01 DIAGNOSIS — Y92410 Unspecified street and highway as the place of occurrence of the external cause: Secondary | ICD-10-CM | POA: Insufficient documentation

## 2021-12-01 LAB — CBC WITH DIFFERENTIAL/PLATELET
Abs Immature Granulocytes: 0.04 10*3/uL (ref 0.00–0.07)
Basophils Absolute: 0 10*3/uL (ref 0.0–0.1)
Basophils Relative: 0 %
Eosinophils Absolute: 0.3 10*3/uL (ref 0.0–0.5)
Eosinophils Relative: 3 %
HCT: 41 % (ref 36.0–46.0)
Hemoglobin: 13.8 g/dL (ref 12.0–15.0)
Immature Granulocytes: 0 %
Lymphocytes Relative: 29 %
Lymphs Abs: 3.1 10*3/uL (ref 0.7–4.0)
MCH: 31.7 pg (ref 26.0–34.0)
MCHC: 33.7 g/dL (ref 30.0–36.0)
MCV: 94 fL (ref 80.0–100.0)
Monocytes Absolute: 0.5 10*3/uL (ref 0.1–1.0)
Monocytes Relative: 5 %
Neutro Abs: 6.6 10*3/uL (ref 1.7–7.7)
Neutrophils Relative %: 63 %
Platelets: 270 10*3/uL (ref 150–400)
RBC: 4.36 MIL/uL (ref 3.87–5.11)
RDW: 11.9 % (ref 11.5–15.5)
WBC: 10.5 10*3/uL (ref 4.0–10.5)
nRBC: 0 % (ref 0.0–0.2)

## 2021-12-01 LAB — BASIC METABOLIC PANEL
Anion gap: 8 (ref 5–15)
BUN: 20 mg/dL (ref 8–23)
CO2: 24 mmol/L (ref 22–32)
Calcium: 9.2 mg/dL (ref 8.9–10.3)
Chloride: 103 mmol/L (ref 98–111)
Creatinine, Ser: 1.06 mg/dL — ABNORMAL HIGH (ref 0.44–1.00)
GFR, Estimated: 60 mL/min — ABNORMAL LOW (ref >60)
Glucose, Bld: 160 mg/dL — ABNORMAL HIGH (ref 70–99)
Potassium: 3.8 mmol/L (ref 3.5–5.1)
Sodium: 135 mmol/L (ref 135–145)

## 2021-12-01 LAB — TROPONIN I (HIGH SENSITIVITY): Troponin I (High Sensitivity): 8 ng/L (ref <18)

## 2021-12-01 MED ORDER — KETOROLAC TROMETHAMINE 15 MG/ML IJ SOLN
15.0000 mg | Freq: Once | INTRAMUSCULAR | Status: DC
  Filled 2021-12-01: qty 1

## 2021-12-01 MED ORDER — IOHEXOL 300 MG/ML  SOLN
75.0000 mL | Freq: Once | INTRAMUSCULAR | Status: AC | PRN
  Administered 2021-12-01: 75 mL via INTRAVENOUS

## 2021-12-01 MED ORDER — LIDOCAINE 5 % EX PTCH
1.0000 | MEDICATED_PATCH | CUTANEOUS | Status: DC
  Administered 2021-12-01: 1 via TRANSDERMAL
  Filled 2021-12-01: qty 1

## 2021-12-01 MED ORDER — KETOROLAC TROMETHAMINE 15 MG/ML IJ SOLN
15.0000 mg | Freq: Once | INTRAMUSCULAR | Status: AC
  Administered 2021-12-01: 15 mg via INTRAVENOUS

## 2021-12-01 MED ORDER — ACETAMINOPHEN 325 MG PO TABS
650.0000 mg | ORAL_TABLET | Freq: Once | ORAL | Status: AC
  Administered 2021-12-01: 650 mg via ORAL
  Filled 2021-12-01: qty 2

## 2021-12-01 NOTE — ED Notes (Signed)
Lots of fresh bruises noted to pt's L arm.
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
## 2021-12-01 NOTE — Discharge Instructions (Signed)
Your blood work, chest x-ray, and CT are normal.  Please continue to take Tylenol/ibuprofen per package instructions to help with your symptoms.  Please return for any new, worsening, or change in symptoms or other concerns.  It was a pleasure caring for you today.

## 2021-12-01 NOTE — ED Notes (Signed)
Pt declined offer to add x-ray of R wrist/hand.
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
## 2021-12-01 NOTE — ED Notes (Addendum)
EKG to EDP Stafford in person.  

## 2021-12-01 NOTE — ED Notes (Addendum)
Lab states they need a new tube for BMP. To bedside to recollect but pt states another RN completed recollect and sent tube within last few minutes.
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
## 2023-02-20 ENCOUNTER — Emergency Department
Admission: EM | Admit: 2023-02-20 | Discharge: 2023-02-20 | Discharge status: Against Medical Advice | Payer: Medicare Other

## 2023-02-20 NOTE — ED Notes (Signed)
 No answer when called several times from lobby

## 2024-01-30 ENCOUNTER — Ambulatory Visit: Admitting: Dermatology
